# Patient Record
Sex: Female | Born: 1955
Health system: Southern US, Community
[De-identification: ages and names within clinical notes are randomized; demographics above are authoritative.]

## PROBLEM LIST (undated history)

## (undated) DIAGNOSIS — I10 Essential (primary) hypertension: Secondary | ICD-10-CM

## (undated) DIAGNOSIS — M459 Ankylosing spondylitis of unspecified sites in spine: Secondary | ICD-10-CM

## (undated) HISTORY — PX: WRIST SURGERY: SHX841

## (undated) HISTORY — DX: Essential (primary) hypertension: I10

## (undated) HISTORY — DX: Ankylosing spondylitis of unspecified sites in spine: M45.9

---

## 1999-05-08 ENCOUNTER — Encounter: Admission: RE | Admit: 1999-05-08 | Discharge: 1999-05-08 | Payer: Self-pay | Admitting: Family Medicine

## 1999-05-08 ENCOUNTER — Encounter: Payer: Self-pay | Admitting: Family Medicine

## 1999-05-12 ENCOUNTER — Encounter: Payer: Self-pay | Admitting: Emergency Medicine

## 1999-05-12 ENCOUNTER — Emergency Department (HOSPITAL_COMMUNITY): Admission: EM | Admit: 1999-05-12 | Discharge: 1999-05-12 | Payer: Self-pay | Admitting: Emergency Medicine

## 2000-05-09 ENCOUNTER — Encounter: Admission: RE | Admit: 2000-05-09 | Discharge: 2000-05-09 | Payer: Self-pay | Admitting: Obstetrics and Gynecology

## 2000-05-09 ENCOUNTER — Encounter: Payer: Self-pay | Admitting: Obstetrics and Gynecology

## 2001-05-10 ENCOUNTER — Encounter: Payer: Self-pay | Admitting: Obstetrics and Gynecology

## 2001-05-10 ENCOUNTER — Encounter: Admission: RE | Admit: 2001-05-10 | Discharge: 2001-05-10 | Payer: Self-pay | Admitting: Obstetrics and Gynecology

## 2001-11-16 ENCOUNTER — Encounter (INDEPENDENT_AMBULATORY_CARE_PROVIDER_SITE_OTHER): Payer: Self-pay | Admitting: Specialist

## 2001-11-16 ENCOUNTER — Ambulatory Visit (HOSPITAL_BASED_OUTPATIENT_CLINIC_OR_DEPARTMENT_OTHER): Admission: RE | Admit: 2001-11-16 | Discharge: 2001-11-16 | Payer: Self-pay | Admitting: Obstetrics and Gynecology

## 2002-05-15 ENCOUNTER — Encounter: Payer: Self-pay | Admitting: Obstetrics and Gynecology

## 2002-05-15 ENCOUNTER — Encounter: Admission: RE | Admit: 2002-05-15 | Discharge: 2002-05-15 | Payer: Self-pay | Admitting: Obstetrics and Gynecology

## 2003-06-07 ENCOUNTER — Encounter: Admission: RE | Admit: 2003-06-07 | Discharge: 2003-06-07 | Payer: Self-pay | Admitting: Obstetrics and Gynecology

## 2004-06-09 ENCOUNTER — Encounter: Admission: RE | Admit: 2004-06-09 | Discharge: 2004-06-09 | Payer: Self-pay | Admitting: Obstetrics and Gynecology

## 2005-06-23 ENCOUNTER — Encounter: Admission: RE | Admit: 2005-06-23 | Discharge: 2005-06-23 | Payer: Self-pay | Admitting: Obstetrics and Gynecology

## 2006-06-27 ENCOUNTER — Encounter: Admission: RE | Admit: 2006-06-27 | Discharge: 2006-06-27 | Payer: Self-pay | Admitting: Obstetrics and Gynecology

## 2007-07-06 ENCOUNTER — Encounter: Admission: RE | Admit: 2007-07-06 | Discharge: 2007-07-06 | Payer: Self-pay | Admitting: Family Medicine

## 2008-02-28 ENCOUNTER — Encounter: Admission: RE | Admit: 2008-02-28 | Discharge: 2008-02-28 | Payer: Self-pay | Admitting: Family Medicine

## 2008-07-08 ENCOUNTER — Encounter: Admission: RE | Admit: 2008-07-08 | Discharge: 2008-07-08 | Payer: Self-pay | Admitting: Obstetrics & Gynecology

## 2008-07-18 ENCOUNTER — Ambulatory Visit: Payer: Self-pay | Admitting: Sports Medicine

## 2008-07-18 DIAGNOSIS — M2020 Hallux rigidus, unspecified foot: Secondary | ICD-10-CM | POA: Insufficient documentation

## 2008-07-18 DIAGNOSIS — M545 Low back pain, unspecified: Secondary | ICD-10-CM | POA: Insufficient documentation

## 2008-07-18 DIAGNOSIS — S838X9A Sprain of other specified parts of unspecified knee, initial encounter: Secondary | ICD-10-CM | POA: Insufficient documentation

## 2008-07-18 DIAGNOSIS — I10 Essential (primary) hypertension: Secondary | ICD-10-CM | POA: Insufficient documentation

## 2008-07-18 DIAGNOSIS — S86819A Strain of other muscle(s) and tendon(s) at lower leg level, unspecified leg, initial encounter: Secondary | ICD-10-CM

## 2009-07-09 ENCOUNTER — Encounter: Admission: RE | Admit: 2009-07-09 | Discharge: 2009-07-09 | Payer: Self-pay | Admitting: Obstetrics & Gynecology

## 2009-07-22 ENCOUNTER — Ambulatory Visit: Payer: Self-pay | Admitting: Family Medicine

## 2009-07-22 DIAGNOSIS — M25569 Pain in unspecified knee: Secondary | ICD-10-CM | POA: Insufficient documentation

## 2009-07-22 DIAGNOSIS — M629 Disorder of muscle, unspecified: Secondary | ICD-10-CM | POA: Insufficient documentation

## 2010-03-19 NOTE — Assessment & Plan Note (Signed)
Summary: L KNEE PAIN,RUNNER,MC   Vital Signs:  Patient profile:   55 year old female Height:      65 inches Weight:      130 pounds BMI:     21.71 BP sitting:   130 / 82  Vitals Entered By: Lillia Pauls CMA (July 22, 2009 10:55 AM)  History of Present Illness: Pt presents with left lateral knee pain that has occurred since she ran a 41 K race in March. She noticed that the lateral portion or her left knee became very painful at about mile 3. Since that 10 K she has decreased her mileage and rested. However, she is training to run the [place] 10 K and has been working on increasing her mileage recently. Again, at mile 3 or so during her long run she noticed the lateral left knee pain. As soon as she stops to rest or walk, the pain dissipates. No known injuries or prior surgeries. Denies swelling and locking. Sometimes gets a posterior cramping sensation in her calf.   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   Head:  normocephalic and atraumatic.   Ears:  Normal hearing Mouth:  MMM Neck:  supple and full ROM.   Lungs:  normal respiratory effort.   Msk:  Left Knee: No bony abnormalities, edema or bruising Full ROM with pain on flexion pinch laterally + TTP over the IT band at Gerdy's tubercle No TTP along the joint line Normal MCL, LCL, ACL and PCL with special testing Neg McMurray's, extended leg bounce test 5/5 strength with resisted knee flexion and extension Tight IT band noted with TTP over distal portion with hip abducted Normal IR and ER of her hip 4/5 strength with resisted hip flexion  4+/5 strength with resisted abduction Mildy tight FABER testing  Right Knee: Normal inspection, palpation, FROM and strength Normal MCL, LCL, ACL and PCL with special testing Neg McMurray's and  extended leg bounce test Tight FABER testing 5/5 strength with resisted hip flexion 4+/5 strength with resisted hip abduction  Equal leg lengths Good running form  Feet: Bilateral accessory naviculars Bilateral bunnionettes Transverse arch breakdown Tight FABER   Impression & Recommendations:  Problem # 1:  KNEE PAIN (ICD-719.46) Assessment New Due to ITB syndrome 1. Ice massage for 20 minutes after running 2. 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 Use a foam roller for 5-10 minutes prior to running to stretch out and massage ITB 2. Given a hand out with multiple ITB stretches and some hip strengthening exercises to do daily 3. May continue running as tolerated on a flat softer surface 4. Return as needed in 4-6 weeks for follow-up

## 2010-07-03 NOTE — Op Note (Signed)
   NAME:  Tonya Davis, Tonya Davis                           ACCOUNT NO.:  [id number]   MEDICAL RECORD NO.:  [id number]                   PATIENT TYPE:  AMB   LOCATION:  [facility]                                 FACILITY:  [facility]   PHYSICIAN:  Katherine Roan, M.D.               DATE OF BIRTH:  Sep 07, 1955   DATE OF PROCEDURE:  11/16/2001  DATE OF DISCHARGE:                                 OPERATIVE REPORT   PREOPERATIVE DIAGNOSES:  Menorrhagia, posterior and uterine fibroids.   POSTOPERATIVE DIAGNOSES:  Menorrhagia, posterior and uterine fibroids.   OPERATION:  Hysteroscopy with resection.   DESCRIPTION OF PROCEDURE:  The patient was placed in lithotomy position,  prepped and draped in the usual fashion. The cervix dilated, endometrial  cavity was visualized. There was a posterior myoma which was resected and I  did a gentle endometrial resection. All the tissue was sent to the labs for  study. [name] tolerated this procedure well and was sent to the recovery room  in good condition.                                                Katherine Roan, M.D.    SDM/MEDQ  D:  11/16/2001  T:  11/17/2001  Job:  [id number]

## 2010-07-27 ENCOUNTER — Other Ambulatory Visit: Payer: Self-pay | Admitting: Obstetrics & Gynecology

## 2010-07-27 DIAGNOSIS — Z1231 Encounter for screening mammogram for malignant neoplasm of breast: Secondary | ICD-10-CM

## 2010-08-05 ENCOUNTER — Ambulatory Visit
Admission: RE | Admit: 2010-08-05 | Discharge: 2010-08-05 | Disposition: A | Discharge status: Home / Self Care | Payer: 59 | Source: Ambulatory Visit | Attending: Obstetrics & Gynecology | Admitting: Obstetrics & Gynecology

## 2010-08-05 DIAGNOSIS — Z1231 Encounter for screening mammogram for malignant neoplasm of breast: Secondary | ICD-10-CM

## 2010-12-25 ENCOUNTER — Ambulatory Visit (INDEPENDENT_AMBULATORY_CARE_PROVIDER_SITE_OTHER): Payer: 59 | Admitting: Sports Medicine

## 2010-12-25 VITALS — BP 120/76

## 2010-12-25 DIAGNOSIS — S838X9A Sprain of other specified parts of unspecified knee, initial encounter: Secondary | ICD-10-CM

## 2010-12-25 NOTE — Patient Instructions (Signed)
Be cautious with hills and too much distance.  Do hip abduction, flexion, and standing rotations exercises daily, try with a light ankle weight.    After getting stronger with the initial exercises- try doing sit to stand exercise holding dumbbells   Please follow up in 6 weeks

## 2010-12-25 NOTE — Progress Notes (Signed)
  Subjective:    Patient ID: Tonya Davis, female    DOB: [birth year], 55 y.o.   MRN: [id number]  HPI  Pt presents to clinic for evaluation of left anterior hip pain since 10/24/10 on a 10 mile run.  Runs about 20 miles per week.  Around the time pain started using step ups holding dumbells Has pain when going from sitting to standing With running has more pain with going uphill, in longer runs pain is in anterior L hip, and radiates around to posterior hip.   Review of Systems     Objective:   Physical Exam  RT hip- full ROM, rotational motion 75 deg Lt hip 75-80 deg rotational motion Hip flexion strength on rt good, strong on lt, but causes pain Leg lengths equal FABER normal bilaterally SI joints move freely IR and ER of lt hip good, causing no pain Lt hip abduction weak Rt hip abduction strong HS strong bilat  Arches neutral  1st MTP hypertrophy bilat, no true bunions     Assessment & Plan:

## 2010-12-25 NOTE — Assessment & Plan Note (Signed)
This appears to be a strain of the left iliopsoas muscle I think this was related to the weight lifting activity but probably was triggered by chronic weakness of the left gluteus medius  Exercises are given Reduce her running by about 40%  We should recheck this in about 6 weeks and then will steadily increase her activity again if responding well

## 2011-02-03 ENCOUNTER — Ambulatory Visit: Payer: 59 | Admitting: Sports Medicine

## 2011-02-24 ENCOUNTER — Ambulatory Visit (INDEPENDENT_AMBULATORY_CARE_PROVIDER_SITE_OTHER): Payer: 59 | Admitting: Sports Medicine

## 2011-02-24 VITALS — BP 114/68

## 2011-02-24 DIAGNOSIS — S838X9A Sprain of other specified parts of unspecified knee, initial encounter: Secondary | ICD-10-CM

## 2011-02-24 DIAGNOSIS — S86819A Strain of other muscle(s) and tendon(s) at lower leg level, unspecified leg, initial encounter: Secondary | ICD-10-CM

## 2011-02-24 NOTE — Patient Instructions (Signed)
Start doing step up exercises  Please follow up as needed  Thank you for seeing [place] today!

## 2011-02-24 NOTE — Assessment & Plan Note (Signed)
last visit we suspected that she had a hip flexor tendinitis probably her iliopsoas tendon  Rehabilitation exercises have worked very well and she is able to resume her running  She should continue these  Add more step exercises  Gradually increase her running mileage as tolerated Return when necessary

## 2011-02-24 NOTE — Progress Notes (Signed)
  Subjective:    Patient ID: Tonya Davis, female    DOB: [birth year], 56 y.o.   MRN: [id number]  HPI  Pt presents to clinic for f/u of Lt anterior hip pain which she reports is 60-70% improved. Pain would usually start after 10 mile runs and was worsening at last visit.   Now able to run up to 6.5 miles without pain.  Has decreased running to 15 mpw.  Taking mobic which is helpful.  Has some discomfort with sitting for longer periods of time.   Review of Systems     Objective:   Physical Exam  Hip ROM normal bilat No pain on resisted hip flexion on lt in 3 different positions Strong hip abduction and rotation on lt Able to do step up exercises without pain       Assessment & Plan:

## 2011-08-23 ENCOUNTER — Other Ambulatory Visit: Payer: Self-pay | Admitting: Obstetrics & Gynecology

## 2011-08-23 DIAGNOSIS — Z1231 Encounter for screening mammogram for malignant neoplasm of breast: Secondary | ICD-10-CM

## 2011-09-08 ENCOUNTER — Ambulatory Visit
Admission: RE | Admit: 2011-09-08 | Discharge: 2011-09-08 | Disposition: A | Discharge status: Home / Self Care | Payer: 59 | Source: Ambulatory Visit | Attending: Obstetrics & Gynecology | Admitting: Obstetrics & Gynecology

## 2011-09-08 DIAGNOSIS — Z1231 Encounter for screening mammogram for malignant neoplasm of breast: Secondary | ICD-10-CM

## 2012-08-22 ENCOUNTER — Other Ambulatory Visit: Payer: Self-pay | Admitting: Obstetrics & Gynecology

## 2012-08-22 DIAGNOSIS — Z1231 Encounter for screening mammogram for malignant neoplasm of breast: Secondary | ICD-10-CM

## 2012-09-08 ENCOUNTER — Ambulatory Visit: Payer: 59

## 2012-09-11 ENCOUNTER — Ambulatory Visit
Admission: RE | Admit: 2012-09-11 | Discharge: 2012-09-11 | Disposition: A | Discharge status: Home / Self Care | Payer: 59 | Source: Ambulatory Visit | Attending: Obstetrics & Gynecology | Admitting: Obstetrics & Gynecology

## 2012-09-11 ENCOUNTER — Other Ambulatory Visit: Payer: Self-pay | Admitting: Endocrinology

## 2012-09-11 DIAGNOSIS — Z1231 Encounter for screening mammogram for malignant neoplasm of breast: Secondary | ICD-10-CM

## 2012-09-11 DIAGNOSIS — N951 Menopausal and female climacteric states: Secondary | ICD-10-CM

## 2013-08-08 ENCOUNTER — Other Ambulatory Visit: Payer: Self-pay

## 2013-08-08 ENCOUNTER — Other Ambulatory Visit: Payer: Self-pay | Admitting: Endocrinology

## 2013-08-08 DIAGNOSIS — Z1231 Encounter for screening mammogram for malignant neoplasm of breast: Secondary | ICD-10-CM

## 2013-09-13 ENCOUNTER — Ambulatory Visit
Admission: RE | Admit: 2013-09-13 | Discharge: 2013-09-13 | Disposition: A | Discharge status: Home / Self Care | Payer: 59 | Source: Ambulatory Visit

## 2013-09-13 ENCOUNTER — Ambulatory Visit
Admission: RE | Admit: 2013-09-13 | Discharge: 2013-09-13 | Disposition: A | Discharge status: Home / Self Care | Payer: 59 | Source: Ambulatory Visit | Attending: Endocrinology | Admitting: Endocrinology

## 2013-09-13 ENCOUNTER — Encounter (INDEPENDENT_AMBULATORY_CARE_PROVIDER_SITE_OTHER): Payer: Self-pay

## 2013-09-13 DIAGNOSIS — N951 Menopausal and female climacteric states: Secondary | ICD-10-CM

## 2013-09-13 DIAGNOSIS — Z1231 Encounter for screening mammogram for malignant neoplasm of breast: Secondary | ICD-10-CM

## 2013-11-30 ENCOUNTER — Other Ambulatory Visit: Payer: Self-pay

## 2014-10-07 ENCOUNTER — Other Ambulatory Visit: Payer: Self-pay

## 2014-10-07 DIAGNOSIS — Z1231 Encounter for screening mammogram for malignant neoplasm of breast: Secondary | ICD-10-CM

## 2014-10-09 ENCOUNTER — Ambulatory Visit
Admission: RE | Admit: 2014-10-09 | Discharge: 2014-10-09 | Disposition: A | Discharge status: Home / Self Care | Payer: 59 | Source: Ambulatory Visit

## 2014-10-09 DIAGNOSIS — Z1231 Encounter for screening mammogram for malignant neoplasm of breast: Secondary | ICD-10-CM

## 2014-11-13 ENCOUNTER — Encounter: Payer: Self-pay | Admitting: Family Medicine

## 2014-11-13 ENCOUNTER — Ambulatory Visit (INDEPENDENT_AMBULATORY_CARE_PROVIDER_SITE_OTHER): Payer: 59 | Admitting: Family Medicine

## 2014-11-13 ENCOUNTER — Ambulatory Visit
Admission: RE | Admit: 2014-11-13 | Discharge: 2014-11-13 | Disposition: A | Discharge status: Home / Self Care | Payer: 59 | Source: Ambulatory Visit | Attending: Family Medicine | Admitting: Family Medicine

## 2014-11-13 VITALS — BP 111/77 | HR 58 | Ht 65.5 in | Wt 160.0 lb

## 2014-11-13 DIAGNOSIS — M79672 Pain in left foot: Secondary | ICD-10-CM

## 2014-11-13 NOTE — Assessment & Plan Note (Signed)
Would be we're presentation for a stress fracture or acute fracture of the left forefoot. She does have tenosynovium right [place] on ultrasound findings including focal areas of anechoic fluid with pain on pressure and hyperemia on Doppler. -We will obtain x-ray to verify there is no underlying fracture of her left foot. -Recommend ice comfortable shoes to take some of the stress off her extensor tendons. As well continue with her Mobic and can take Tylenol for intermittent pain -Another possibility is that the tenosynovium  is a flare of her ankylosing spondylitis. If this continues for 1-2 weeks I would consider a steroid taper to see if this improves her pain.

## 2014-11-13 NOTE — Progress Notes (Signed)
  Tonya Davis - 59 y.o. female MRN [id number]  Date of birth: [birth year] Tonya Davis is a 59 y.o. female who presents today for left dorsal foot pain.  Left dorsal foot pain, initial visit-patient presents today for ongoing left dorsal foot pain for the past week. She denies a specific injury but has been having swelling on the top of her dorsum of the foot. No increase in activity or changes in underlying physical activity. She did wear 1 pair of dansko shoes but otherwise has not changed her shoes or surface. There is no radiation or toes and she denies any previous injury or fracture to the left foot. She does have a history of ankylosing spondylitis with first MTP joint arthritis of the left foot. Currently on NSAIDs for the arthritis and followed by rheumatology.  PMHx - Updated and reviewed.  Contributory factors include: ankylosing spondylitis PSHx - Updated and reviewed.  Contributory factors include:  noncontributory FHx - Updated and reviewed.  Contributory factors include:  noncontributory Medications - Mobic   ROS Per HPI . 12 point negative otherwise  Exam:  Filed Vitals:   11/13/14 1443  BP: 111/77  Pulse: 58   Gen: NAD Cardiorespiratory - Normal respiratory effort/rate.  RRR Exam of the foot: There is visible soft tissue edema in the dorsum of the metatarsal shafts 2 through 5. No erythema. Tenderness palpation at the third metatarsal shaft/neck region. Tuning fork test and this area is negative. Squeeze test is negative with a negative Mulder's sign.she has full range of motion of the ankle and toes. Neurovascularly intact.  Imaging:  Ultrasound of the left foot reveals no obvious cortical abnormality of the metatarsal shaft neck or head of digits 2 through 5. She does have extensive localized tenosynovitisright [place] with anechoic fluid around the tendon sheaths

## 2014-11-27 ENCOUNTER — Ambulatory Visit (INDEPENDENT_AMBULATORY_CARE_PROVIDER_SITE_OTHER): Payer: 59 | Admitting: Family Medicine

## 2014-11-27 ENCOUNTER — Encounter: Payer: Self-pay | Admitting: Family Medicine

## 2014-11-27 VITALS — BP 140/102 | Ht 65.5 in | Wt 160.0 lb

## 2014-11-27 DIAGNOSIS — M79672 Pain in left foot: Secondary | ICD-10-CM

## 2014-11-27 MED ORDER — PREDNISONE 10 MG (21) PO TBPK: 10.0000 mg | ORAL_TABLET | Freq: Every day | ORAL | Status: DC

## 2014-11-27 NOTE — Assessment & Plan Note (Signed)
Possible tenosynovitis of extensor tendons of her foot secondary to extensive walking. X-rays reviewed of her left foot which do not show acute fracture or cortical disruption of her metatarsal shafts. She does have significant first MTP osteoarthritis with hallux rigidus. -Steroid Dosepak for inflammation. Continue with ice and activity modification and consider toe exercises. -If she does not get much improvement with this would consider green insoles with metatarsal pads and possible orthotics down the road.

## 2014-11-27 NOTE — Progress Notes (Signed)
  Tonya SprangSusan T Davis - 59 y.o. female MRN [id number]  Date of birth: January 15, 1956 Tonya SprangSusan T Davis is a 59 y.o. female who presents today for left dorsal foot pain.  Left dorsal foot pain 11/13/14 -patient presents today for ongoing left dorsal foot pain for the past week. She denies a specific injury but has been having swelling on the top of her dorsum of the foot. No increase in activity or changes in underlying physical activity. She did wear 1 pair of dansko shoes but otherwise has not changed her shoes or surface. There is no radiation or toes and she denies any previous injury or fracture to the left foot. She does have a history of ankylosing spondylitis with first MTP joint arthritis of the left foot. Currently on NSAIDs for the arthritis and followed by rheumatology.  Visit 11/27/14-patient returns today for follow-up of left foot pain dorsum aspect. She is starting to have pain in the plantar aspect of her metatarsal heads. She has cut back on her walking and has been icing along with taking Mobic, which has helped somewhat for her. No new injury and denies painful ambulation.    PMHx - Updated and reviewed.  Contributory factors include: ankylosing spondylitis PSHx - Updated and reviewed.  Contributory factors include:  noncontributory FHx - Updated and reviewed.  Contributory factors include:  noncontributory Medications - Mobic   ROS Per HPI . 12 point negative otherwise  Exam:  Filed Vitals:   11/27/14 1542  BP: 140/102   Gen: NAD Cardiorespiratory - Normal respiratory effort/rate.  RRR Exam of the foot: There is visible soft tissue edema in the dorsum of the metatarsal shafts 2 through 5. No erythema. Diffuse TTP MT heads/extensor digitorum tendons worse w/ toe extension.  She has full range of motion of the ankle and toes. Neurovascularly intact.

## 2015-11-05 ENCOUNTER — Other Ambulatory Visit: Payer: Self-pay | Admitting: Obstetrics & Gynecology

## 2015-11-05 DIAGNOSIS — Z1231 Encounter for screening mammogram for malignant neoplasm of breast: Secondary | ICD-10-CM

## 2015-11-14 ENCOUNTER — Ambulatory Visit
Admission: RE | Admit: 2015-11-14 | Discharge: 2015-11-14 | Disposition: A | Discharge status: Home / Self Care | Payer: 59 | Source: Ambulatory Visit | Attending: Obstetrics & Gynecology | Admitting: Obstetrics & Gynecology

## 2015-11-14 DIAGNOSIS — Z1231 Encounter for screening mammogram for malignant neoplasm of breast: Secondary | ICD-10-CM

## 2016-04-06 DIAGNOSIS — H15103 Unspecified episcleritis, bilateral: Secondary | ICD-10-CM | POA: Diagnosis not present

## 2016-04-06 DIAGNOSIS — R5383 Other fatigue: Secondary | ICD-10-CM | POA: Diagnosis not present

## 2016-04-06 DIAGNOSIS — M545 Low back pain: Secondary | ICD-10-CM | POA: Diagnosis not present

## 2016-04-06 DIAGNOSIS — M459 Ankylosing spondylitis of unspecified sites in spine: Secondary | ICD-10-CM | POA: Diagnosis not present

## 2016-05-13 DIAGNOSIS — I10 Essential (primary) hypertension: Secondary | ICD-10-CM | POA: Diagnosis not present

## 2016-05-13 DIAGNOSIS — N390 Urinary tract infection, site not specified: Secondary | ICD-10-CM | POA: Diagnosis not present

## 2016-05-13 DIAGNOSIS — Z0000 Encounter for general adult medical examination without abnormal findings: Secondary | ICD-10-CM | POA: Diagnosis not present

## 2016-06-23 DIAGNOSIS — I471 Supraventricular tachycardia: Secondary | ICD-10-CM | POA: Diagnosis not present

## 2016-06-23 DIAGNOSIS — R002 Palpitations: Secondary | ICD-10-CM | POA: Diagnosis not present

## 2016-06-23 DIAGNOSIS — R197 Diarrhea, unspecified: Secondary | ICD-10-CM | POA: Diagnosis not present

## 2016-07-05 DIAGNOSIS — H15103 Unspecified episcleritis, bilateral: Secondary | ICD-10-CM | POA: Diagnosis not present

## 2016-07-05 DIAGNOSIS — M459 Ankylosing spondylitis of unspecified sites in spine: Secondary | ICD-10-CM | POA: Diagnosis not present

## 2016-08-02 DIAGNOSIS — J029 Acute pharyngitis, unspecified: Secondary | ICD-10-CM | POA: Diagnosis not present

## 2016-08-02 DIAGNOSIS — K121 Other forms of stomatitis: Secondary | ICD-10-CM | POA: Diagnosis not present

## 2016-08-16 DIAGNOSIS — J029 Acute pharyngitis, unspecified: Secondary | ICD-10-CM | POA: Diagnosis not present

## 2016-08-16 DIAGNOSIS — K120 Recurrent oral aphthae: Secondary | ICD-10-CM | POA: Diagnosis not present

## 2016-09-06 DIAGNOSIS — S00522A Blister (nonthermal) of oral cavity, initial encounter: Secondary | ICD-10-CM | POA: Diagnosis not present

## 2016-10-06 DIAGNOSIS — M2550 Pain in unspecified joint: Secondary | ICD-10-CM | POA: Diagnosis not present

## 2016-10-06 DIAGNOSIS — M459 Ankylosing spondylitis of unspecified sites in spine: Secondary | ICD-10-CM | POA: Diagnosis not present

## 2016-11-09 DIAGNOSIS — M545 Low back pain: Secondary | ICD-10-CM | POA: Diagnosis not present

## 2016-11-09 DIAGNOSIS — M9901 Segmental and somatic dysfunction of cervical region: Secondary | ICD-10-CM | POA: Diagnosis not present

## 2016-11-09 DIAGNOSIS — M9903 Segmental and somatic dysfunction of lumbar region: Secondary | ICD-10-CM | POA: Diagnosis not present

## 2016-11-10 DIAGNOSIS — M9903 Segmental and somatic dysfunction of lumbar region: Secondary | ICD-10-CM | POA: Diagnosis not present

## 2016-11-10 DIAGNOSIS — M545 Low back pain: Secondary | ICD-10-CM | POA: Diagnosis not present

## 2016-11-10 DIAGNOSIS — M9901 Segmental and somatic dysfunction of cervical region: Secondary | ICD-10-CM | POA: Diagnosis not present

## 2016-11-11 DIAGNOSIS — M545 Low back pain: Secondary | ICD-10-CM | POA: Diagnosis not present

## 2016-11-11 DIAGNOSIS — M9903 Segmental and somatic dysfunction of lumbar region: Secondary | ICD-10-CM | POA: Diagnosis not present

## 2016-11-11 DIAGNOSIS — M9901 Segmental and somatic dysfunction of cervical region: Secondary | ICD-10-CM | POA: Diagnosis not present

## 2016-11-15 DIAGNOSIS — M9901 Segmental and somatic dysfunction of cervical region: Secondary | ICD-10-CM | POA: Diagnosis not present

## 2016-11-15 DIAGNOSIS — M545 Low back pain: Secondary | ICD-10-CM | POA: Diagnosis not present

## 2016-11-15 DIAGNOSIS — M9903 Segmental and somatic dysfunction of lumbar region: Secondary | ICD-10-CM | POA: Diagnosis not present

## 2016-11-17 DIAGNOSIS — M9901 Segmental and somatic dysfunction of cervical region: Secondary | ICD-10-CM | POA: Diagnosis not present

## 2016-11-17 DIAGNOSIS — M9903 Segmental and somatic dysfunction of lumbar region: Secondary | ICD-10-CM | POA: Diagnosis not present

## 2016-11-17 DIAGNOSIS — M545 Low back pain: Secondary | ICD-10-CM | POA: Diagnosis not present

## 2016-11-18 DIAGNOSIS — M545 Low back pain: Secondary | ICD-10-CM | POA: Diagnosis not present

## 2016-11-18 DIAGNOSIS — M9903 Segmental and somatic dysfunction of lumbar region: Secondary | ICD-10-CM | POA: Diagnosis not present

## 2016-11-18 DIAGNOSIS — M9901 Segmental and somatic dysfunction of cervical region: Secondary | ICD-10-CM | POA: Diagnosis not present

## 2016-12-14 DIAGNOSIS — Z23 Encounter for immunization: Secondary | ICD-10-CM | POA: Diagnosis not present

## 2016-12-24 ENCOUNTER — Other Ambulatory Visit: Payer: Self-pay | Admitting: Family Medicine

## 2016-12-24 DIAGNOSIS — Z1231 Encounter for screening mammogram for malignant neoplasm of breast: Secondary | ICD-10-CM

## 2017-01-10 IMAGING — CR DG FOOT COMPLETE 3+V*L*
3 series · 3 of 3 positions shown · non-contrast
Comparison: 04/14/2010

CLINICAL DATA: Left foot pain and swelling injury, initial
encounter

EXAM:
LEFT FOOT - COMPLETE 3+ VIEW

[view not recorded (1 of 3)]
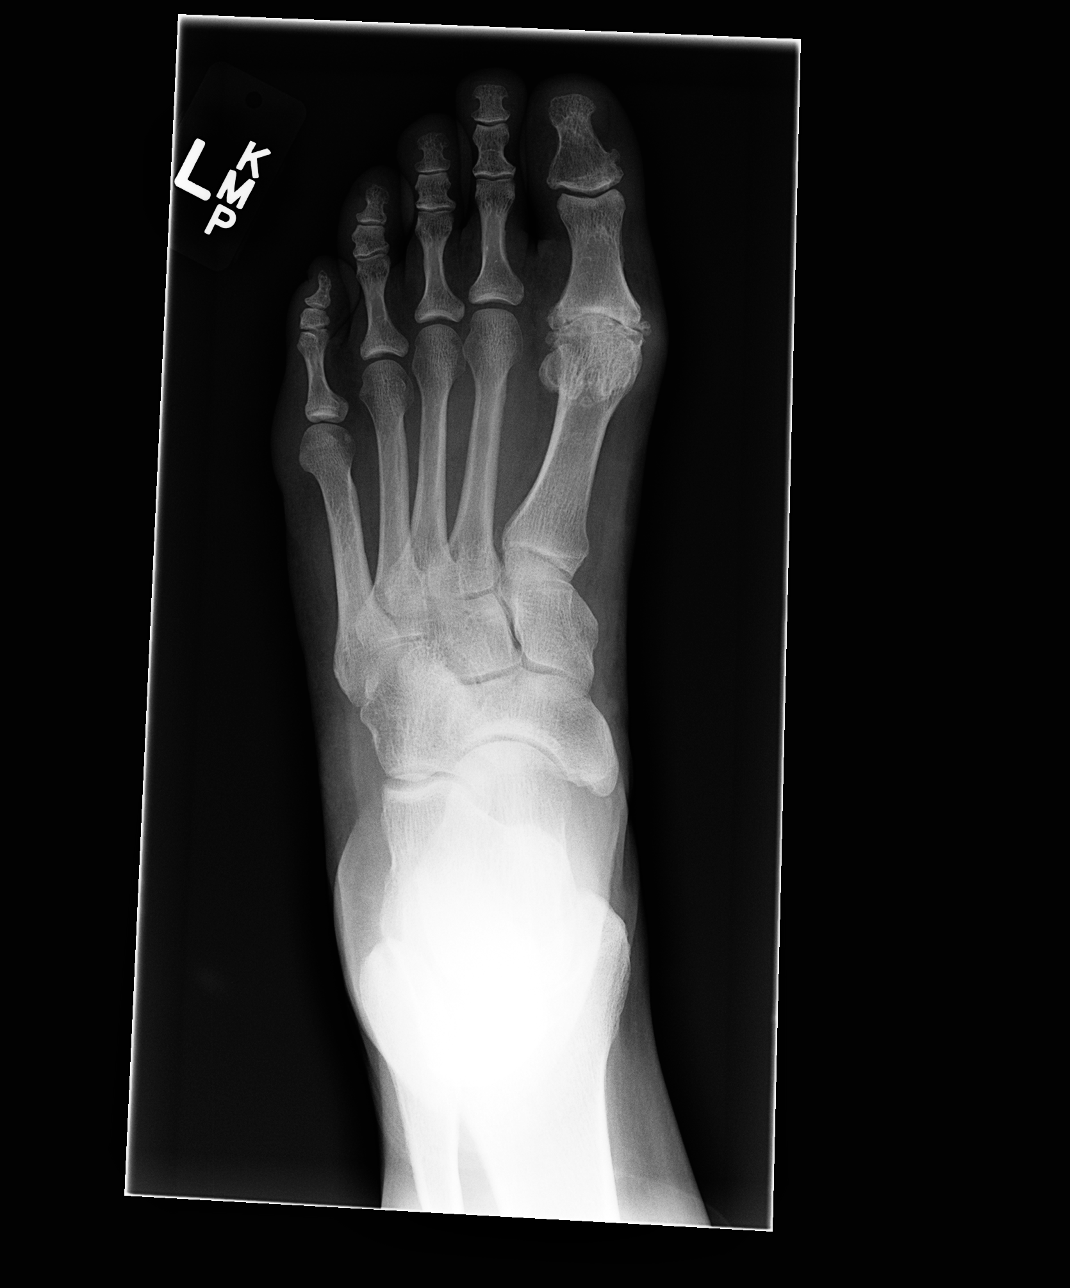

[view not recorded (2 of 3)]
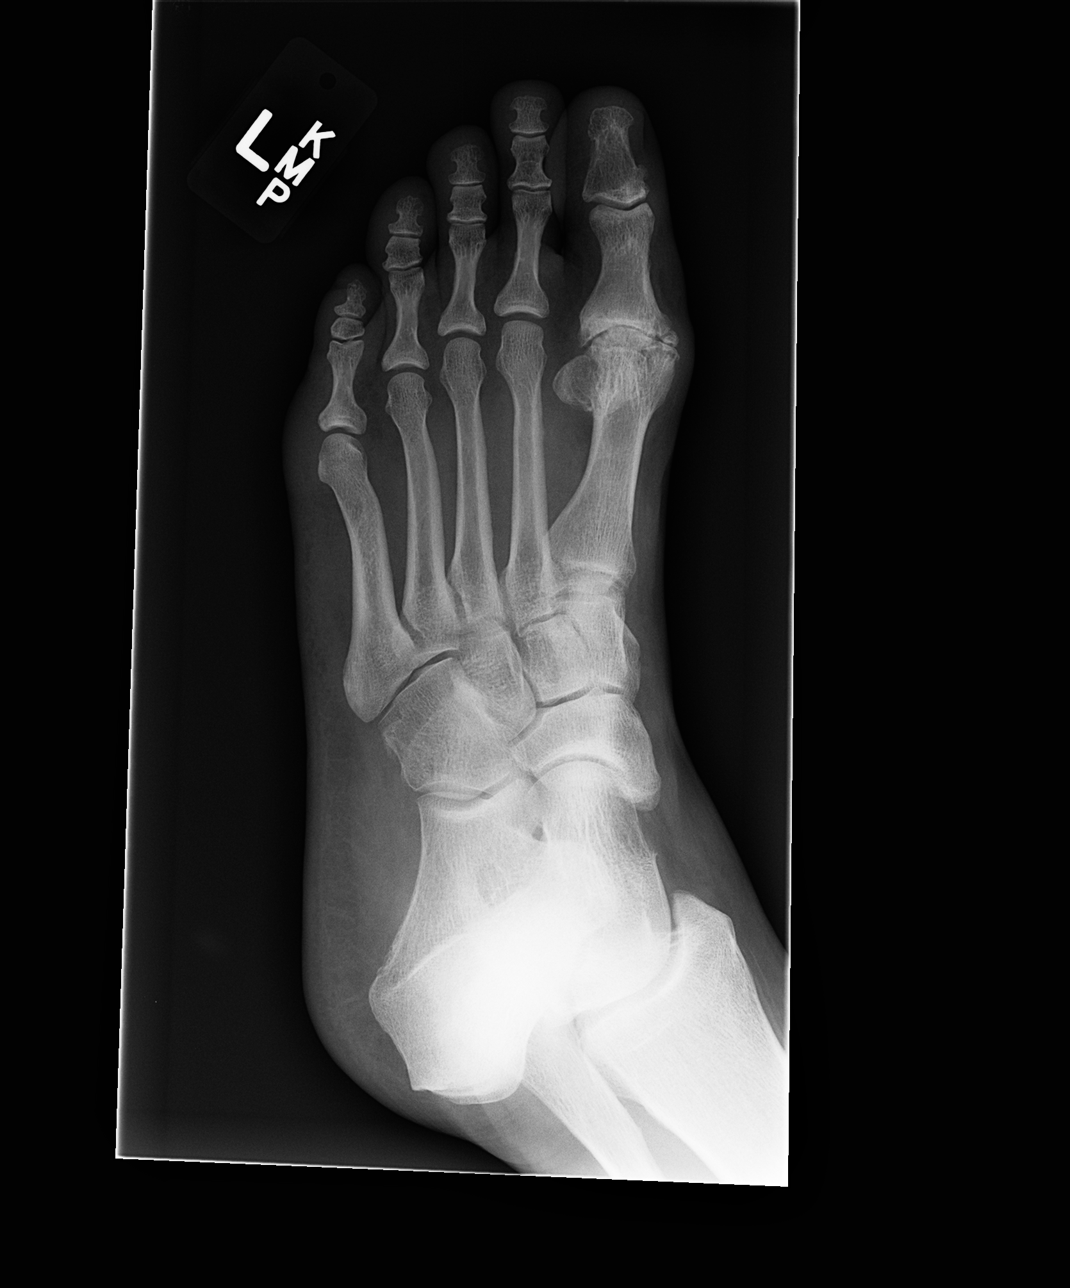

[view not recorded (3 of 3)]
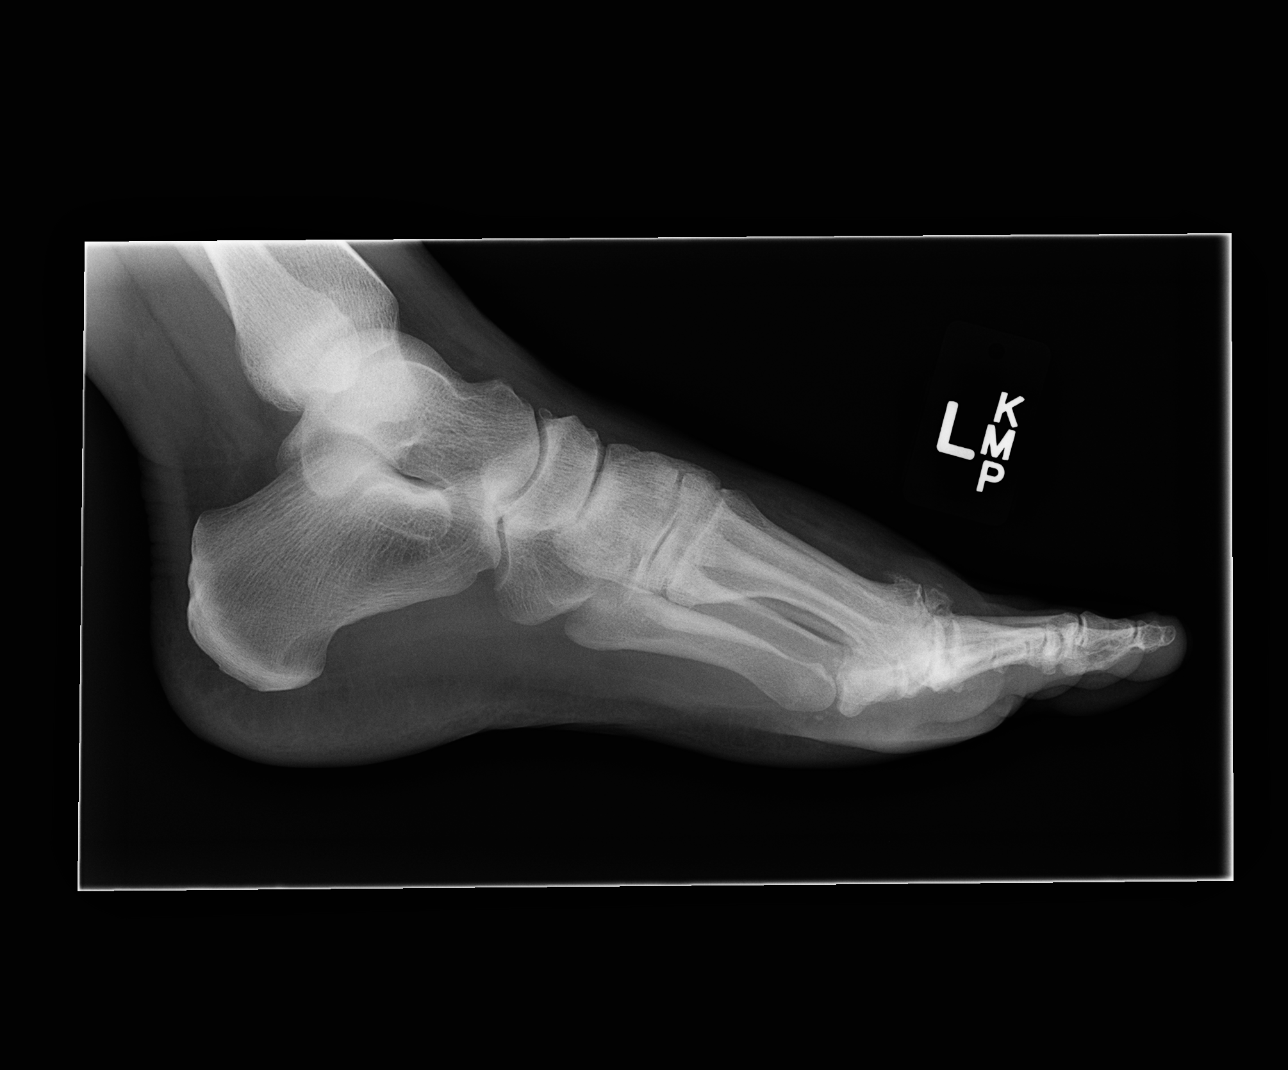

[3 of 3 positions shown; findings below may reference images not displayed]

FINDINGS: Significant degenerative changes are noted in the first MTP joint.
These have progressed in the interval from the prior exam. Mild
associated soft tissue swelling is noted. No other degenerative
changes are seen. No acute fracture or dislocation is noted.
IMPRESSION: Progressive degenerative change at the first MTP joint. No acute
abnormality is noted.

## 2017-01-24 ENCOUNTER — Ambulatory Visit: Payer: 59

## 2017-03-03 ENCOUNTER — Ambulatory Visit
Admission: RE | Admit: 2017-03-03 | Discharge: 2017-03-03 | Disposition: A | Discharge status: Home / Self Care | Payer: 59 | Source: Ambulatory Visit | Attending: Family Medicine | Admitting: Family Medicine

## 2017-03-03 DIAGNOSIS — Z1231 Encounter for screening mammogram for malignant neoplasm of breast: Secondary | ICD-10-CM

## 2017-04-07 DIAGNOSIS — H15103 Unspecified episcleritis, bilateral: Secondary | ICD-10-CM | POA: Diagnosis not present

## 2017-04-07 DIAGNOSIS — M459 Ankylosing spondylitis of unspecified sites in spine: Secondary | ICD-10-CM | POA: Diagnosis not present

## 2017-05-25 DIAGNOSIS — M4690 Unspecified inflammatory spondylopathy, site unspecified: Secondary | ICD-10-CM | POA: Diagnosis not present

## 2017-05-25 DIAGNOSIS — J302 Other seasonal allergic rhinitis: Secondary | ICD-10-CM | POA: Diagnosis not present

## 2017-05-25 DIAGNOSIS — Z0000 Encounter for general adult medical examination without abnormal findings: Secondary | ICD-10-CM | POA: Diagnosis not present

## 2017-05-25 DIAGNOSIS — I10 Essential (primary) hypertension: Secondary | ICD-10-CM | POA: Diagnosis not present

## 2017-10-06 DIAGNOSIS — M459 Ankylosing spondylitis of unspecified sites in spine: Secondary | ICD-10-CM | POA: Diagnosis not present

## 2017-10-06 DIAGNOSIS — H15103 Unspecified episcleritis, bilateral: Secondary | ICD-10-CM | POA: Diagnosis not present

## 2017-12-09 DIAGNOSIS — Z23 Encounter for immunization: Secondary | ICD-10-CM | POA: Diagnosis not present

## 2017-12-21 DIAGNOSIS — K219 Gastro-esophageal reflux disease without esophagitis: Secondary | ICD-10-CM | POA: Diagnosis not present

## 2017-12-21 DIAGNOSIS — R140 Abdominal distension (gaseous): Secondary | ICD-10-CM | POA: Diagnosis not present

## 2018-01-04 ENCOUNTER — Encounter: Payer: Self-pay | Admitting: Obstetrics & Gynecology

## 2018-01-04 ENCOUNTER — Ambulatory Visit: Payer: 59 | Admitting: Obstetrics & Gynecology

## 2018-01-04 VITALS — BP 140/90 | Ht 65.0 in | Wt 133.4 lb

## 2018-01-04 DIAGNOSIS — Z01419 Encounter for gynecological examination (general) (routine) without abnormal findings: Secondary | ICD-10-CM | POA: Diagnosis not present

## 2018-01-04 DIAGNOSIS — Z13820 Encounter for screening for osteoporosis: Secondary | ICD-10-CM | POA: Diagnosis not present

## 2018-01-04 DIAGNOSIS — Z780 Asymptomatic menopausal state: Secondary | ICD-10-CM

## 2018-01-04 NOTE — Progress Notes (Signed)
Tonya Davis [birth year] [id number]   History:    62 y.o. G0 Married.  2 adopted daughters 60 yo in [place] and 60 [place] state.  RP:  Established patient presenting for annual gyn exam   HPI: Menopause, well on no HRT.  No PMB.  No pelvic pain.  No pain with IC, except dryness, will try coconut oil.  Breasts wnl.  BMI 22.2.  Fit and eating well.  Immune disorder, did the nutritional protocol.  Health Labs with Fam MD.  Past medical history,surgical history, family history and social history were all reviewed and documented in the EPIC chart.  Gynecologic History No LMP recorded. Patient is postmenopausal. Contraception: post menopausal status /Vasectomy Last Pap: 11/2015. Results were: Negative/HPV HR neg Last mammogram: 02/2017. Results were: Negative Bone Density: 08/2013 Normal Colonoscopy: 2016  Obstetric History OB History  Gravida Para Term Preterm AB Living  0            SAB TAB Ectopic Multiple Live Births             Obstetric Comments  2 adopted children     ROS: A ROS was performed and pertinent positives and negatives are included in the history.  GENERAL: No fevers or chills. HEENT: No change in vision, no earache, sore throat or sinus congestion. NECK: No pain or stiffness. CARDIOVASCULAR: No chest pain or pressure. No palpitations. PULMONARY: No shortness of breath, cough or wheeze. GASTROINTESTINAL: No abdominal pain, nausea, vomiting or diarrhea, melena or bright red blood per rectum. GENITOURINARY: No urinary frequency, urgency, hesitancy or dysuria. MUSCULOSKELETAL: No joint or muscle pain, no back pain, no recent trauma. DERMATOLOGIC: No rash, no itching, no lesions. ENDOCRINE: No polyuria, polydipsia, no heat or cold intolerance. No recent change in weight. HEMATOLOGICAL: No anemia or easy bruising or bleeding. NEUROLOGIC: No headache, seizures, numbness, tingling or weakness. PSYCHIATRIC: No depression, no loss of interest in normal activity or change in  sleep pattern.     Exam:   BP 140/90   Ht 5\' 5"  (1.651 m)   Wt 133 lb 6.4 oz (60.5 kg)   BMI 22.20 kg/m   Body mass index is 22.2 kg/m.  General appearance : Well developed well nourished female. No acute distress HEENT: Eyes: no retinal hemorrhage or exudates,  Neck supple, trachea midline, no carotid bruits, no thyroidmegaly Lungs: Clear to auscultation, no rhonchi or wheezes, or rib retractions  Heart: Regular rate and rhythm, no murmurs or gallops Breast:Examined in sitting and supine position were symmetrical in appearance, no palpable masses or tenderness,  no skin retraction, no nipple inversion, no nipple discharge, no skin discoloration, no axillary or supraclavicular lymphadenopathy Abdomen: no palpable masses or tenderness, no rebound or guarding Extremities: no edema or skin discoloration or tenderness  Pelvic: Vulva: Normal             Vagina: No gross lesions or discharge  Cervix: No gross lesions or discharge.    Uterus  AV, normal size, shape and consistency, non-tender and mobile  Adnexa  Without masses or tenderness  Anus: Normal   Assessment/Plan:  62 y.o. female for annual exam   1. Encounter for routine gynecological examination with Papanicolaou smear of cervix Normal gynecologic exam in menopause.  Pap test was negative with negative high-risk HPV in October 2017.  Will repeat Pap test next year.  Breast exam normal.  Screening mammogram was negative in January 2019.  Health labs with family physician.  Good body mass  index at 22.2.  Continue with healthy nutrition and fitness.  2. Postmenopausal Well on no hormone replacement therapy.  No postmenopausal bleeding.  3. Screening for osteoporosis Vitamin D supplements, calcium intake of 1.5 g/day and regular weightbearing physical activities recommended.  Patient will follow up here for a bone density. - DG Bone Density; Future   Genia Del MD, 3:16 PM 01/04/2018

## 2018-01-04 NOTE — Patient Instructions (Addendum)
1. Encounter for routine gynecological examination with Papanicolaou smear of cervix Normal gynecologic exam in menopause.  Pap test was negative with negative high-risk HPV in October 2017.  Will repeat Pap test next year.  Breast exam normal.  Screening mammogram was negative in January 2019.  Health labs with family physician.  Good body mass index at 22.2.  Continue with healthy nutrition and fitness.  2. Postmenopausal Well on no hormone replacement therapy.  No postmenopausal bleeding.  3. Screening for osteoporosis Vitamin D supplements, calcium intake of 1.5 g/day and regular weightbearing physical activities recommended.  Patient will follow up here for a bone density. - DG Bone Density; Future  Tonya Davis, it was a pleasure seeing you today!  I will inform you of your bone density results when available.

## 2018-01-05 LAB — PAP IG W/ RFLX HPV ASCU

## 2018-01-06 ENCOUNTER — Encounter: Payer: Self-pay | Admitting: *Deleted

## 2018-03-02 ENCOUNTER — Other Ambulatory Visit: Payer: Self-pay | Admitting: Obstetrics & Gynecology

## 2018-03-02 ENCOUNTER — Ambulatory Visit (INDEPENDENT_AMBULATORY_CARE_PROVIDER_SITE_OTHER): Payer: 59

## 2018-03-02 DIAGNOSIS — M8589 Other specified disorders of bone density and structure, multiple sites: Secondary | ICD-10-CM

## 2018-03-02 DIAGNOSIS — Z13820 Encounter for screening for osteoporosis: Secondary | ICD-10-CM

## 2018-04-07 DIAGNOSIS — M459 Ankylosing spondylitis of unspecified sites in spine: Secondary | ICD-10-CM | POA: Diagnosis not present

## 2018-04-07 DIAGNOSIS — H15103 Unspecified episcleritis, bilateral: Secondary | ICD-10-CM | POA: Diagnosis not present

## 2018-05-31 DIAGNOSIS — J302 Other seasonal allergic rhinitis: Secondary | ICD-10-CM | POA: Diagnosis not present

## 2018-05-31 DIAGNOSIS — M4690 Unspecified inflammatory spondylopathy, site unspecified: Secondary | ICD-10-CM | POA: Diagnosis not present

## 2018-05-31 DIAGNOSIS — I10 Essential (primary) hypertension: Secondary | ICD-10-CM | POA: Diagnosis not present

## 2018-09-07 ENCOUNTER — Other Ambulatory Visit: Payer: Self-pay

## 2018-09-07 DIAGNOSIS — Z20822 Contact with and (suspected) exposure to covid-19: Secondary | ICD-10-CM

## 2018-09-10 LAB — NOVEL CORONAVIRUS, NAA: SARS-CoV-2, NAA: NOT DETECTED

## 2019-01-05 ENCOUNTER — Other Ambulatory Visit: Payer: Self-pay

## 2019-01-08 ENCOUNTER — Ambulatory Visit (INDEPENDENT_AMBULATORY_CARE_PROVIDER_SITE_OTHER): Payer: 59 | Admitting: Obstetrics & Gynecology

## 2019-01-08 ENCOUNTER — Other Ambulatory Visit: Payer: Self-pay

## 2019-01-08 ENCOUNTER — Encounter: Payer: Self-pay | Admitting: Obstetrics & Gynecology

## 2019-01-08 VITALS — BP 132/84 | Ht 65.0 in | Wt 141.0 lb

## 2019-01-08 DIAGNOSIS — Z780 Asymptomatic menopausal state: Secondary | ICD-10-CM | POA: Diagnosis not present

## 2019-01-08 DIAGNOSIS — M8589 Other specified disorders of bone density and structure, multiple sites: Secondary | ICD-10-CM

## 2019-01-08 DIAGNOSIS — Z01419 Encounter for gynecological examination (general) (routine) without abnormal findings: Secondary | ICD-10-CM | POA: Diagnosis not present

## 2019-01-08 NOTE — Patient Instructions (Signed)
1. Well female exam with routine gynecological exam Normal gynecologic exam in menopause.  Pap test November 2019 was negative, no indication to repeat this year.  Breast exam normal.  Overdue for screening mammogram, will schedule now.  Colonoscopy in 2016.  Good body mass index at 23.46.  Continue with fitness and healthy nutrition.  Health labs with family physician.  2. Postmenopausal Well on no hormone replacement therapy.  No postmenopausal bleeding.  3. Osteopenia of multiple sites Very mild osteopenia on bone density January 2020.  May repeat bone density at 2 to 3 years.  Vitamin D supplements, calcium intake of 1200 mg daily and regular weightbearing physical activities to continue.  Tonya Davis, it was a pleasure seeing you today!

## 2019-01-08 NOTE — Progress Notes (Signed)
Tonya Davis 1955/07/25 [id number]   History:    63 y.o.  G0 Married. Partnership for children.  2 adopted daughters 56 yo in [place] and 66 yo [place].  RP:  Established patient presenting for annual gyn exam   HPI: Menopause, well on no HRT.  No PMB.  No pelvic pain.  No pain with IC, except dryness, will try coconut oil.  Breasts wnl.  BMI 23.46.  Fit and eating well. Anksyng Spondylitis  (Immune disorder), doing the nutritional protocol.  Health Labs with Fam MD.  Past medical history,surgical history, family history and social history were all reviewed and documented in the EPIC chart.  Gynecologic History No LMP recorded. Patient is postmenopausal. Contraception: post menopausal status Last Pap: 12/2017. Results were: Negative Last mammogram: 02/2017. Results were: Negative Bone Density: 02/2018 Osteopenia Colonoscopy: 2016  Obstetric History OB History  Gravida Para Term Preterm AB Living  0            SAB TAB Ectopic Multiple Live Births             Obstetric Comments  2 adopted children     ROS: A ROS was performed and pertinent positives and negatives are included in the history.  GENERAL: No fevers or chills. HEENT: No change in vision, no earache, sore throat or sinus congestion. NECK: No pain or stiffness. CARDIOVASCULAR: No chest pain or pressure. No palpitations. PULMONARY: No shortness of breath, cough or wheeze. GASTROINTESTINAL: No abdominal pain, nausea, vomiting or diarrhea, melena or bright red blood per rectum. GENITOURINARY: No urinary frequency, urgency, hesitancy or dysuria. MUSCULOSKELETAL: No joint or muscle pain, no back pain, no recent trauma. DERMATOLOGIC: No rash, no itching, no lesions. ENDOCRINE: No polyuria, polydipsia, no heat or cold intolerance. No recent change in weight. HEMATOLOGICAL: No anemia or easy bruising or bleeding. NEUROLOGIC: No headache, seizures, numbness, tingling or weakness. PSYCHIATRIC: No depression, no loss of  interest in normal activity or change in sleep pattern.     Exam:   BP 132/84    Ht 5\' 5"  (1.651 m)    Wt 141 lb (64 kg)    BMI 23.46 kg/m   Body mass index is 23.46 kg/m.  General appearance : Well developed well nourished female. No acute distress HEENT: Eyes: no retinal hemorrhage or exudates,  Neck supple, trachea midline, no carotid bruits, no thyroidmegaly Lungs: Clear to auscultation, no rhonchi or wheezes, or rib retractions  Heart: Regular rate and rhythm, no murmurs or gallops Breast:Examined in sitting and supine position were symmetrical in appearance, no palpable masses or tenderness,  no skin retraction, no nipple inversion, no nipple discharge, no skin discoloration, no axillary or supraclavicular lymphadenopathy Abdomen: no palpable masses or tenderness, no rebound or guarding Extremities: no edema or skin discoloration or tenderness  Pelvic: Vulva: Normal             Vagina: No gross lesions or discharge  Cervix: No gross lesions or discharge  Uterus  RV, normal size, mildly nodular, non-tender and mobile  Adnexa  Without masses or tenderness  Anus: Normal   Assessment/Plan:  63 y.o. female for annual exam   1. Well female exam with routine gynecological exam Normal gynecologic exam in menopause.  Pap test November 2019 was negative, no indication to repeat this year.  Breast exam normal.  Overdue for screening mammogram, will schedule now.  Colonoscopy in 2016.  Good body mass index at 23.46.  Continue with fitness and healthy nutrition.  Health labs with family physician.  2. Postmenopausal Well on no hormone replacement therapy.  No postmenopausal bleeding.  3. Osteopenia of multiple sites Very mild osteopenia on bone density January 2020.  May repeat bone density at 2 to 3 years.  Vitamin D supplements, calcium intake of 1200 mg daily and regular weightbearing physical activities to continue.  Princess Bruins MD, 4:22 PM 01/08/2019

## 2019-08-29 ENCOUNTER — Other Ambulatory Visit: Payer: Self-pay | Admitting: Family Medicine

## 2019-08-29 DIAGNOSIS — Z1231 Encounter for screening mammogram for malignant neoplasm of breast: Secondary | ICD-10-CM

## 2019-09-04 ENCOUNTER — Ambulatory Visit
Admission: RE | Admit: 2019-09-04 | Discharge: 2019-09-04 | Disposition: A | Discharge status: Home / Self Care | Payer: 59 | Source: Ambulatory Visit | Attending: Family Medicine | Admitting: Family Medicine

## 2019-09-04 ENCOUNTER — Other Ambulatory Visit: Payer: Self-pay

## 2019-09-04 DIAGNOSIS — Z1231 Encounter for screening mammogram for malignant neoplasm of breast: Secondary | ICD-10-CM

## 2020-01-01 ENCOUNTER — Other Ambulatory Visit: Payer: Self-pay | Admitting: *Deleted

## 2020-01-01 DIAGNOSIS — R002 Palpitations: Secondary | ICD-10-CM

## 2020-01-13 ENCOUNTER — Ambulatory Visit: Payer: 59

## 2020-01-14 ENCOUNTER — Encounter: Payer: 59 | Admitting: Obstetrics & Gynecology

## 2020-01-15 ENCOUNTER — Encounter: Payer: Self-pay | Admitting: Obstetrics & Gynecology

## 2020-01-15 ENCOUNTER — Ambulatory Visit (INDEPENDENT_AMBULATORY_CARE_PROVIDER_SITE_OTHER): Payer: 59 | Admitting: Obstetrics & Gynecology

## 2020-01-15 ENCOUNTER — Other Ambulatory Visit: Payer: Self-pay

## 2020-01-15 VITALS — BP 144/92 | Ht 64.5 in | Wt 147.0 lb

## 2020-01-15 DIAGNOSIS — M8589 Other specified disorders of bone density and structure, multiple sites: Secondary | ICD-10-CM

## 2020-01-15 DIAGNOSIS — Z780 Asymptomatic menopausal state: Secondary | ICD-10-CM

## 2020-01-15 DIAGNOSIS — N952 Postmenopausal atrophic vaginitis: Secondary | ICD-10-CM | POA: Diagnosis not present

## 2020-01-15 DIAGNOSIS — Z01419 Encounter for gynecological examination (general) (routine) without abnormal findings: Secondary | ICD-10-CM | POA: Diagnosis not present

## 2020-01-15 MED ORDER — ESTRADIOL 10 MCG VA TABS: 1.0000 | ORAL_TABLET | VAGINAL | 4 refills | Status: DC

## 2020-01-15 NOTE — Progress Notes (Signed)
Tonya Davis 02-Oct-1955 [id number]   History:    64 y.o. G0 Married. 2 adopted daughters 38 yo in [place] and 53 yo in [place].  VO:[id number] presenting for annual gyn exam   VEL:[id number], well on no HRT. No PMB. No pelvic pain. No pain with IC, better with coconut oil.  Would like to start on vaginal Estradiol.Breasts wnl. BMI 24.84. Fit and eating well. Ankylosing Spondylitis, doing the nutritional protocol. Health Labs with Fam MD.  Past medical history,surgical history, family history and social history were all reviewed and documented in the EPIC chart.  Gynecologic History No LMP recorded. Patient is postmenopausal.  Obstetric History OB History  Gravida Para Term Preterm AB Living  0            SAB TAB Ectopic Multiple Live Births             Obstetric Comments  2 adopted children     ROS: A ROS was performed and pertinent positives and negatives are included in the history.  GENERAL: No fevers or chills. HEENT: No change in vision, no earache, sore throat or sinus congestion. NECK: No pain or stiffness. CARDIOVASCULAR: No chest pain or pressure. No palpitations. PULMONARY: No shortness of breath, cough or wheeze. GASTROINTESTINAL: No abdominal pain, nausea, vomiting or diarrhea, melena or bright red blood per rectum. GENITOURINARY: No urinary frequency, urgency, hesitancy or dysuria. MUSCULOSKELETAL: No joint or muscle pain, no back pain, no recent trauma. DERMATOLOGIC: No rash, no itching, no lesions. ENDOCRINE: No polyuria, polydipsia, no heat or cold intolerance. No recent change in weight. HEMATOLOGICAL: No anemia or easy bruising or bleeding. NEUROLOGIC: No headache, seizures, numbness, tingling or weakness. PSYCHIATRIC: No depression, no loss of interest in normal activity or change in sleep pattern.     Exam:   BP (!) 144/92   Ht 5' 4.5" (1.638 m)   Wt 147 lb (66.7 kg)   BMI 24.84 kg/m   Body mass index is 24.84  kg/m.  General appearance : Well developed well nourished female. No acute distress HEENT: Eyes: no retinal hemorrhage or exudates,  Neck supple, trachea midline, no carotid bruits, no thyroidmegaly Lungs: Clear to auscultation, no rhonchi or wheezes, or rib retractions  Heart: Regular rate and rhythm, no murmurs or gallops Breast:Examined in sitting and supine position were symmetrical in appearance, no palpable masses or tenderness,  no skin retraction, no nipple inversion, no nipple discharge, no skin discoloration, no axillary or supraclavicular lymphadenopathy Abdomen: no palpable masses or tenderness, no rebound or guarding Extremities: no edema or skin discoloration or tenderness  Pelvic: Vulva: Normal             Vagina: No gross lesions or discharge  Cervix: No gross lesions or discharge  Uterus RV, normal size, shape and consistency, non-tender and mobile  Adnexa  Without masses or tenderness  Anus: Normal   Assessment/Plan:  64 y.o. female for annual exam   1. Well female exam with routine gynecological exam Normal gynecologic exam in menopause.  No indication for Pap test this year.  Breast exam normal.  Screening mammogram July 2021 was negative.  Colonoscopy 2016.  Good body mass index at 24.84. Continue with fitness and healthy nutrition.  Health labs with family physician.  2. Postmenopausal Well on no systemic hormone replacement therapy.  No postmenopausal bleeding.  3. Post-menopausal atrophic vaginitis Decision to start on estradiol tablets vaginally twice a week.  No contraindication.  Usage reviewed and prescription sent to pharmacy.  4. Osteopenia of multiple sites Will repeat BD at 3 yrs 02/2021.  Continue with vitamin D supplements, calcium intake of 1500 mg daily and regular weightbearing physical activities.  Other orders - bisoprolol-hydrochlorothiazide (ZIAC) 10-6.25 MG tablet; Take 1 tablet by mouth daily. - Estradiol 10 MCG TABS vaginal tablet; Place 1  tablet (10 mcg total) vaginally 2 (two) times a week.  Tonya Del MD, 4:24 PM 01/15/2020

## 2020-01-16 ENCOUNTER — Encounter: Payer: Self-pay | Admitting: Obstetrics & Gynecology

## 2020-01-21 ENCOUNTER — Ambulatory Visit: Payer: 59 | Admitting: Cardiology

## 2020-11-07 ENCOUNTER — Other Ambulatory Visit: Payer: Self-pay | Admitting: Obstetrics & Gynecology

## 2020-11-07 DIAGNOSIS — Z1231 Encounter for screening mammogram for malignant neoplasm of breast: Secondary | ICD-10-CM

## 2020-11-12 ENCOUNTER — Ambulatory Visit
Admission: RE | Admit: 2020-11-12 | Discharge: 2020-11-12 | Disposition: A | Discharge status: Home / Self Care | Payer: 59 | Source: Ambulatory Visit

## 2020-11-12 ENCOUNTER — Other Ambulatory Visit: Payer: Self-pay

## 2020-11-12 DIAGNOSIS — Z1231 Encounter for screening mammogram for malignant neoplasm of breast: Secondary | ICD-10-CM

## 2021-01-15 ENCOUNTER — Ambulatory Visit (INDEPENDENT_AMBULATORY_CARE_PROVIDER_SITE_OTHER): Payer: 59 | Admitting: Obstetrics & Gynecology

## 2021-01-15 ENCOUNTER — Encounter: Payer: Self-pay | Admitting: Obstetrics & Gynecology

## 2021-01-15 ENCOUNTER — Other Ambulatory Visit (HOSPITAL_COMMUNITY)
Admission: RE | Admit: 2021-01-15 | Discharge: 2021-01-15 | Disposition: A | Discharge status: Home / Self Care | Payer: 59 | Source: Ambulatory Visit | Attending: Obstetrics & Gynecology | Admitting: Obstetrics & Gynecology

## 2021-01-15 ENCOUNTER — Other Ambulatory Visit: Payer: Self-pay

## 2021-01-15 VITALS — BP 122/80 | HR 55 | Resp 16 | Ht 64.25 in | Wt 152.0 lb

## 2021-01-15 DIAGNOSIS — Z01419 Encounter for gynecological examination (general) (routine) without abnormal findings: Secondary | ICD-10-CM | POA: Diagnosis not present

## 2021-01-15 DIAGNOSIS — Z780 Asymptomatic menopausal state: Secondary | ICD-10-CM | POA: Diagnosis not present

## 2021-01-15 DIAGNOSIS — N952 Postmenopausal atrophic vaginitis: Secondary | ICD-10-CM

## 2021-01-15 DIAGNOSIS — M8589 Other specified disorders of bone density and structure, multiple sites: Secondary | ICD-10-CM | POA: Diagnosis not present

## 2021-01-15 NOTE — Progress Notes (Signed)
Tonya Davis 20-May-1955 [id number]   History:    65 y.o. G0 Married.  2 adopted daughters 8 yo in [place] and 61 yo in [place].   RP:  Established patient presenting for annual gyn exam    HPI: Postmenopause, well on no HRT.  No PMB.  No pelvic pain.  No pain with IC, better with coconut oil. Pap Neg 01/04/2018.  Pap reflex today.  Breasts wnl. Screen mammo 11/12/2020 Neg. BMI 25.89.  Fit and eating well. Ankylosing Spondylitis, doing the nutritional protocol.  Health Labs with Fam MD.  BD Osteopenia 03/02/2018 T-Score -1.5, will schedule here now.  [facility] 2016, will schedule with [facility].   Past medical history,surgical history, family history and social history were all reviewed and documented in the EPIC chart.  Gynecologic History No LMP recorded. Patient is postmenopausal.  Obstetric History OB History  Gravida Para Term Preterm AB Living  0            SAB IAB Ectopic Multiple Live Births             Obstetric Comments  2 adopted children     ROS: A ROS was performed and pertinent positives and negatives are included in the history.  GENERAL: No fevers or chills. HEENT: No change in vision, no earache, sore throat or sinus congestion. NECK: No pain or stiffness. CARDIOVASCULAR: No chest pain or pressure. No palpitations. PULMONARY: No shortness of breath, cough or wheeze. GASTROINTESTINAL: No abdominal pain, nausea, vomiting or diarrhea, melena or bright red blood per rectum. GENITOURINARY: No urinary frequency, urgency, hesitancy or dysuria. MUSCULOSKELETAL: No joint or muscle pain, no back pain, no recent trauma. DERMATOLOGIC: No rash, no itching, no lesions. ENDOCRINE: No polyuria, polydipsia, no heat or cold intolerance. No recent change in weight. HEMATOLOGICAL: No anemia or easy bruising or bleeding. NEUROLOGIC: No headache, seizures, numbness, tingling or weakness. PSYCHIATRIC: No depression, no loss of interest in normal activity or change in sleep pattern.      Exam:   BP 122/80   Pulse (!) 55   Resp 16   Ht 5' 4.25" (1.632 m)   Wt 152 lb (68.9 kg)   BMI 25.89 kg/m   Body mass index is 25.89 kg/m.  General appearance : Well developed well nourished female. No acute distress HEENT: Eyes: no retinal hemorrhage or exudates,  Neck supple, trachea midline, no carotid bruits, no thyroidmegaly Lungs: Clear to auscultation, no rhonchi or wheezes, or rib retractions  Heart: Regular rate and rhythm, no murmurs or gallops Breast:Examined in sitting and supine position were symmetrical in appearance, no palpable masses or tenderness,  no skin retraction, no nipple inversion, no nipple discharge, no skin discoloration, no axillary or supraclavicular lymphadenopathy Abdomen: no palpable masses or tenderness, no rebound or guarding Extremities: no edema or skin discoloration or tenderness  Pelvic: Vulva: Normal             Vagina: No gross lesions or discharge  Cervix: No gross lesions or discharge.  Pap reflex done.  Uterus  RV, normal size, shape and consistency, non-tender and mobile  Adnexa  Without masses or tenderness  Anus: Normal   Assessment/Plan:  65 y.o. female for annual exam   1. Encounter for routine gynecological examination with Papanicolaou smear of cervix Postmenopause, well on no HRT.  No PMB.  No pelvic pain.  No pain with IC, better with coconut oil. Pap Neg 01/04/2018.  Pap reflex today.  Breasts wnl. Screen mammo 11/12/2020 Neg.  BMI 25.89.  Fit and eating well. Ankylosing Spondylitis, doing the nutritional protocol.  Health Labs with Fam MD.  BD Osteopenia 03/02/2018 T-Score -1.5, will schedule here now.  [place] 2016, will schedule with [facility]. - Cytology - PAP( [name])  2. Postmenopausal Postmenopause, well on no HRT.  No PMB.  No pelvic pain.  No pain with IC, better with coconut oil.   3. Post-menopausal atrophic vaginitis No pain with IC, better with coconut oil.   4. Osteopenia of multiple sites BD Osteopenia  03/02/2018 T-Score -1.5, will schedule here now.  Vit D, Ca++ total 1.2-1.5 g/d, continue weight bearing physical activities. - DG Bone Density; Future  Other orders - bisoprolol-hydrochlorothiazide (ZIAC) 5-6.25 MG tablet; Take 1 tablet by mouth every morning.   Tonya Del MD, 4:21 PM 01/15/2021

## 2021-01-19 LAB — CYTOLOGY - PAP: Diagnosis: NEGATIVE

## 2021-02-02 ENCOUNTER — Other Ambulatory Visit: Payer: Self-pay | Admitting: Obstetrics & Gynecology

## 2021-02-02 ENCOUNTER — Ambulatory Visit (INDEPENDENT_AMBULATORY_CARE_PROVIDER_SITE_OTHER): Payer: 59

## 2021-02-02 ENCOUNTER — Other Ambulatory Visit: Payer: Self-pay

## 2021-02-02 DIAGNOSIS — Z780 Asymptomatic menopausal state: Secondary | ICD-10-CM | POA: Diagnosis not present

## 2021-02-02 DIAGNOSIS — Z13820 Encounter for screening for osteoporosis: Secondary | ICD-10-CM

## 2021-02-02 DIAGNOSIS — M8589 Other specified disorders of bone density and structure, multiple sites: Secondary | ICD-10-CM

## 2022-01-18 ENCOUNTER — Ambulatory Visit (HOSPITAL_COMMUNITY)
Admission: RE | Admit: 2022-01-18 | Discharge: 2022-01-18 | Disposition: A | Discharge status: Home / Self Care | Payer: 59 | Source: Ambulatory Visit | Attending: Physician Assistant | Admitting: Physician Assistant

## 2022-01-18 ENCOUNTER — Inpatient Hospital Stay (HOSPITAL_COMMUNITY)
Admission: RE | Admit: 2022-01-18 | Discharge: 2022-01-18 | Disposition: A | Discharge status: Home / Self Care | Payer: 59 | Source: Ambulatory Visit | Attending: Physician Assistant | Admitting: Physician Assistant

## 2022-01-18 VITALS — BP 166/102 | HR 50 | Ht 64.25 in | Wt 155.4 lb

## 2022-01-18 DIAGNOSIS — I480 Paroxysmal atrial fibrillation: Secondary | ICD-10-CM | POA: Diagnosis not present

## 2022-01-18 DIAGNOSIS — R001 Bradycardia, unspecified: Secondary | ICD-10-CM | POA: Diagnosis not present

## 2022-01-18 DIAGNOSIS — Z7901 Long term (current) use of anticoagulants: Secondary | ICD-10-CM | POA: Diagnosis not present

## 2022-01-18 DIAGNOSIS — I4891 Unspecified atrial fibrillation: Secondary | ICD-10-CM

## 2022-01-18 DIAGNOSIS — I10 Essential (primary) hypertension: Secondary | ICD-10-CM | POA: Insufficient documentation

## 2022-01-18 MED ORDER — APIXABAN 5 MG PO TABS: 5.0000 mg | ORAL_TABLET | Freq: Two times a day (BID) | ORAL | 3 refills | Status: DC

## 2022-01-18 NOTE — Progress Notes (Signed)
Primary Care Physician: Elias Else, MD Primary Cardiologist: none Primary Electrophysiologist: none Referring Physician: Dr Dennard Schaumann Tonya Davis is a 66 y.o. female with a history of HTN, HLD, ankylosing spondylitis, atrial fibrillation who presents for consultation in the [facility].  The patient was initially diagnosed with atrial fibrillation 12/31/21 after presenting to her PCP with symptoms of palpitations. Her Garmin smart watch also alerted that she had possible afib. ECG at that time showed SR but her Garmin strips do show true afib (personally reviewed today). Patient was started on Eliquis for a CHADS2VASC score of 2. Today, patient is in SR and feeling well. She denies significant snoring or alcohol use. She did have COVID about one month prior to the onset of her palpitations.   Today, she denies symptoms of chest pain, shortness of breath, orthopnea, PND, lower extremity edema, dizziness, presyncope, syncope, snoring, daytime somnolence, bleeding, or neurologic sequela. The patient is tolerating medications without difficulties and is otherwise without complaint today.    Atrial Fibrillation Risk Factors:  she does not have symptoms or diagnosis of sleep apnea. she does not have a history of rheumatic fever. she does have a history of alcohol use. The patient does have a history of early familial atrial fibrillation or other arrhythmias. Father had PPM.  she has a BMI of Body mass index is 26.47 kg/m.Marland Kitchen Filed Weights   01/18/22 0941  Weight: 70.5 kg    Family History  Problem Relation Age of Onset   Cancer Mother        lung - smoker   Hypertension Mother    Diabetes Father    Hypertension Father    Diabetes Paternal Aunt    Cancer Maternal Grandmother        colon   Hypertension Maternal Grandmother    Hypertension Maternal Grandfather    Hypertension Paternal Grandmother    Hypertension Paternal Grandfather      Atrial  Fibrillation Management history:  Previous antiarrhythmic drugs: none Previous cardioversions: none Previous ablations: none CHADS2VASC score: 2 Anticoagulation history: Eliquis   Past Medical History:  Diagnosis Date   Ankylosing spondylitis (HCC)    Hypertension    Past Surgical History:  Procedure Laterality Date   WRIST SURGERY     left     Current Outpatient Medications  Medication Sig Dispense Refill   apixaban (ELIQUIS) 2.5 MG TABS tablet Take 5 mg by mouth 2 (two) times daily.     bisoprolol-hydrochlorothiazide (ZIAC) 5-6.25 MG tablet Take 1 tablet by mouth every morning.     dextromethorphan-guaiFENesin (MUCINEX DM) 30-600 MG 12hr tablet Take 1 tablet by mouth 2 (two) times daily.     famotidine-calcium carbonate-magnesium hydroxide (PEPCID COMPLETE) 10-800-165 MG chewable tablet Chew 1 tablet by mouth as needed.     Multiple Vitamin (MULTIVITAMIN) capsule Take 1 capsule by mouth daily.     No current facility-administered medications for this encounter.    Allergies  Allergen Reactions   Cosentyx [Secukinumab] Other (See Comments)    Blisters on mouth/diarrhea    Social History   Socioeconomic History   Marital status: Married    Spouse name: Not on file   Number of children: Not on file   Years of education: Not on file   Highest education level: Not on file  Occupational History   Not on file  Tobacco Use   Smoking status: Never   Smokeless tobacco: Never  Vaping Use   Vaping Use: Never  used  Substance and Sexual Activity   Alcohol use: Not Currently    Comment: OCC   Drug use: Never   Sexual activity: Yes    Partners: Male    Birth control/protection: Post-menopausal    Comment: 1st intercourse- 75, partners- 89, married- 43 yrs   Other Topics Concern   Not on file  Social History Narrative   Not on file   Social Determinants of Health   Financial Resource Strain: Not on file  Food Insecurity: Not on file  Transportation Needs: Not on  file  Physical Activity: Not on file  Stress: Not on file  Social Connections: Not on file  Intimate Partner Violence: Not on file     ROS- All systems are reviewed and negative except as per the HPI above.  Physical Exam: Vitals:   01/18/22 0941  BP: (!) 166/102  Pulse: (!) 50  Weight: 70.5 kg  Height: 5' 4.25" (1.632 m)    GEN- The patient is a well appearing female, alert and oriented x 3 today.   Head- normocephalic, atraumatic Eyes-  Sclera clear, conjunctiva pink Ears- hearing intact Oropharynx- clear Neck- supple  Lungs- Clear to ausculation bilaterally, normal work of breathing Heart- Regular rate and rhythm, bradycardia, no murmurs, rubs or gallops  GI- soft, NT, ND, + BS Extremities- no clubbing, cyanosis, or edema MS- no significant deformity or atrophy Skin- no rash or lesion Psych- euthymic mood, full affect Neuro- strength and sensation are intact  Wt Readings from Last 3 Encounters:  01/18/22 70.5 kg  01/15/21 68.9 kg  01/15/20 66.7 kg    EKG today demonstrates  SB Vent. rate 50 BPM PR interval 170 ms QRS duration 84 ms QT/QTcB 450/410 ms   Epic records are reviewed at length today  CHA2DS2-VASc Score = 2  The patient's score is based upon: CHF History: 0 HTN History: 1 Diabetes History: 0 Stroke History: 0 Vascular Disease History: 0 Age Score: 0 Gender Score: 1       ASSESSMENT AND PLAN: 1. Paroxysmal Atrial Fibrillation (ICD10:  I48.0) The patient's CHA2DS2-VASc score is 2, indicating a 2.2% annual risk of stroke.   General education about afib provided and questions answered. We also discussed her stroke risk and the risks and benefits of anticoagulation. Will have her wear a 2 week Zio monitor to better quantify afib burden and rate control.   Check echocardiogram  Continue Eliquis 5 mg BID Continue bisoprolol-HCTZ 5-6.25 mg daily Will not increase AV nodal agent with baseline bradycardia today.   2. HTN Elevated today,  typically better controlled. Will reassess at follow up.   Follow up in the AF clinic in 3-4 weeks.    [facility] [address] [place], [place] [address] [phone] 01/18/2022 10:10 AM

## 2022-01-25 ENCOUNTER — Ambulatory Visit (HOSPITAL_COMMUNITY)
Admission: RE | Admit: 2022-01-25 | Discharge: 2022-01-25 | Disposition: A | Discharge status: Home / Self Care | Payer: 59 | Source: Ambulatory Visit | Attending: Family Medicine | Admitting: Family Medicine

## 2022-01-25 DIAGNOSIS — I10 Essential (primary) hypertension: Secondary | ICD-10-CM | POA: Insufficient documentation

## 2022-01-25 DIAGNOSIS — I480 Paroxysmal atrial fibrillation: Secondary | ICD-10-CM | POA: Diagnosis not present

## 2022-01-25 DIAGNOSIS — E785 Hyperlipidemia, unspecified: Secondary | ICD-10-CM | POA: Diagnosis not present

## 2022-01-25 DIAGNOSIS — I081 Rheumatic disorders of both mitral and tricuspid valves: Secondary | ICD-10-CM | POA: Diagnosis not present

## 2022-01-25 LAB — ECHOCARDIOGRAM COMPLETE
Area-P 1/2: 3.91 cm2
S' Lateral: 2.9 cm

## 2022-01-27 ENCOUNTER — Encounter (HOSPITAL_COMMUNITY): Payer: Self-pay | Admitting: *Deleted

## 2022-02-04 ENCOUNTER — Telehealth (HOSPITAL_COMMUNITY): Payer: Self-pay | Admitting: *Deleted

## 2022-02-04 ENCOUNTER — Other Ambulatory Visit: Payer: Self-pay | Admitting: Obstetrics & Gynecology

## 2022-02-04 DIAGNOSIS — Z1231 Encounter for screening mammogram for malignant neoplasm of breast: Secondary | ICD-10-CM

## 2022-02-04 MED ORDER — AMLODIPINE BESYLATE 2.5 MG PO TABS: 2.5000 mg | ORAL_TABLET | Freq: Every day | ORAL | 3 refills | Status: AC

## 2022-02-04 NOTE — Telephone Encounter (Signed)
Pt BP continues to be elevated at home BP 140-150/90-100 Discussed with Tonya Loa PA will start amlodipine 2.5mg  once a day follow up as scheduled early January. Pt in agreement.

## 2022-02-09 NOTE — Addendum Note (Signed)
Encounter addended by: Shona Simpson, RN on: 02/09/2022 9:55 AM  Actions taken: Imaging Exam ended

## 2022-02-17 ENCOUNTER — Encounter (HOSPITAL_COMMUNITY): Payer: Self-pay | Admitting: Physician Assistant

## 2022-02-17 ENCOUNTER — Ambulatory Visit (HOSPITAL_COMMUNITY)
Admission: RE | Admit: 2022-02-17 | Discharge: 2022-02-17 | Disposition: A | Discharge status: Home / Self Care | Payer: 59 | Source: Ambulatory Visit | Attending: Physician Assistant | Admitting: Physician Assistant

## 2022-02-17 VITALS — BP 136/88 | HR 51 | Ht 64.25 in | Wt 155.2 lb

## 2022-02-17 DIAGNOSIS — E785 Hyperlipidemia, unspecified: Secondary | ICD-10-CM | POA: Diagnosis not present

## 2022-02-17 DIAGNOSIS — M459 Ankylosing spondylitis of unspecified sites in spine: Secondary | ICD-10-CM | POA: Insufficient documentation

## 2022-02-17 DIAGNOSIS — I480 Paroxysmal atrial fibrillation: Secondary | ICD-10-CM | POA: Diagnosis not present

## 2022-02-17 DIAGNOSIS — I10 Essential (primary) hypertension: Secondary | ICD-10-CM | POA: Diagnosis not present

## 2022-02-17 DIAGNOSIS — Z7901 Long term (current) use of anticoagulants: Secondary | ICD-10-CM | POA: Diagnosis not present

## 2022-02-17 DIAGNOSIS — Z79899 Other long term (current) drug therapy: Secondary | ICD-10-CM | POA: Diagnosis not present

## 2022-02-17 NOTE — Progress Notes (Signed)
Primary Care Physician: Maury Dus, MD Primary Cardiologist: none Primary Electrophysiologist: none Referring Physician: Dr Dierdre Highman Tonya Davis is a 67 y.o. female with a history of HTN, HLD, ankylosing spondylitis, atrial fibrillation who presents for follow up in the [facility].  The patient was initially diagnosed with atrial fibrillation 12/31/21 after presenting to her PCP with symptoms of palpitations. Her Garmin smart watch also alerted that she had possible afib. ECG at that time showed SR but her Garmin strips do show true afib (personally reviewed). Patient was started on Eliquis for a CHADS2VASC score of 2. She denies significant snoring or alcohol use. She did have COVID about one month prior to the onset of her palpitations.   On follow up today, patient reports that her palpitations have steadily improved. They are less frequent and not as noticeable. Zio monitor showed 1% afib burden with several brief episodes each day. No bleeding issues on anticoagulation.   Today, she denies symptoms of chest pain, shortness of breath, orthopnea, PND, lower extremity edema, dizziness, presyncope, syncope, snoring, daytime somnolence, bleeding, or neurologic sequela. The patient is tolerating medications without difficulties and is otherwise without complaint today.    Atrial Fibrillation Risk Factors:  she does not have symptoms or diagnosis of sleep apnea. she does not have a history of rheumatic fever. she does have a history of alcohol use. The patient does have a history of early familial atrial fibrillation or other arrhythmias. Father had PPM.  she has a BMI of Body mass index is 26.43 kg/m.Marland Kitchen Filed Weights   02/17/22 1009  Weight: 70.4 kg    Family History  Problem Relation Age of Onset   Cancer Mother        lung - smoker   Hypertension Mother    Diabetes Father    Hypertension Father    Diabetes Paternal Aunt    Cancer Maternal  Grandmother        colon   Hypertension Maternal Grandmother    Hypertension Maternal Grandfather    Hypertension Paternal Grandmother    Hypertension Paternal Grandfather      Atrial Fibrillation Management history:  Previous antiarrhythmic drugs: none Previous cardioversions: none Previous ablations: none CHADS2VASC score: 2 Anticoagulation history: Eliquis   Past Medical History:  Diagnosis Date   Ankylosing spondylitis ([place])    Hypertension    Past Surgical History:  Procedure Laterality Date   WRIST SURGERY     left     Current Outpatient Medications  Medication Sig Dispense Refill   amLODipine (NORVASC) 2.5 MG tablet Take 1 tablet (2.5 mg total) by mouth daily. 30 tablet 3   apixaban (ELIQUIS) 5 MG TABS tablet Take 1 tablet (5 mg total) by mouth 2 (two) times daily. 60 tablet 3   bisoprolol-hydrochlorothiazide (ZIAC) 5-6.25 MG tablet Take 1 tablet by mouth every morning.     famotidine-calcium carbonate-magnesium hydroxide (PEPCID COMPLETE) 10-800-165 MG chewable tablet Chew 1 tablet by mouth as needed.     Multiple Vitamin (MULTIVITAMIN) capsule Take 1 capsule by mouth daily.     No current facility-administered medications for this encounter.    Allergies  Allergen Reactions   Cosentyx [Secukinumab] Other (See Comments)    Blisters on mouth/diarrhea    Social History   Socioeconomic History   Marital status: Married    Spouse name: Not on file   Number of children: Not on file   Years of education: Not on file   Highest  education level: Not on file  Occupational History   Not on file  Tobacco Use   Smoking status: Never   Smokeless tobacco: Never   Tobacco comments:    Never smoke 02/17/22  Vaping Use   Vaping Use: Never used  Substance and Sexual Activity   Alcohol use: Not Currently    Comment: OCC   Drug use: Never   Sexual activity: Yes    Partners: Male    Birth control/protection: Post-menopausal    Comment: 1st intercourse- 30,  partners- 68, married- 72 yrs   Other Topics Concern   Not on file  Social History Narrative   Not on file   Social Determinants of Health   Financial Resource Strain: Not on file  Food Insecurity: Not on file  Transportation Needs: Not on file  Physical Activity: Not on file  Stress: Not on file  Social Connections: Not on file  Intimate Partner Violence: Not on file     ROS- All systems are reviewed and negative except as per the HPI above.  Physical Exam: Vitals:   02/17/22 1009  BP: 136/88  Pulse: (!) 51  Weight: 70.4 kg  Height: 5' 4.25" (1.632 m)     GEN- The patient is a well appearing female, alert and oriented x 3 today.   HEENT-head normocephalic, atraumatic, sclera clear, conjunctiva pink, hearing intact, trachea midline. Lungs- Clear to ausculation bilaterally, normal work of breathing Heart- Regular rate and rhythm, no murmurs, rubs or gallops  GI- soft, NT, ND, + BS Extremities- no clubbing, cyanosis, or edema MS- no significant deformity or atrophy Skin- no rash or lesion Psych- euthymic mood, full affect Neuro- strength and sensation are intact   Wt Readings from Last 3 Encounters:  02/17/22 70.4 kg  01/18/22 70.5 kg  01/15/21 68.9 kg    EKG today demonstrates  SB Vent. rate 51 BPM PR interval 180 ms QRS duration 86 ms QT/QTcB 442/407 ms   Epic records are reviewed at length today  CHA2DS2-VASc Score = 2  The patient's score is based upon: CHF History: 0 HTN History: 1 Diabetes History: 0 Stroke History: 0 Vascular Disease History: 0 Age Score: 0 Gender Score: 1       ASSESSMENT AND PLAN: 1. Paroxysmal Atrial Fibrillation (ICD10:  I48.0) The patient's CHA2DS2-VASc score is 2, indicating a 2.2% annual risk of stroke.   Zio monitor showed 1% afib burden with avg HR 109 bpm. She had several brief episodes each day. We discussed rhythm control options including AAD and ablation. She does not feel her symptoms at this time warrant  additional rhythm control, especially since her symptoms are becoming less frequent. Will pursue watchful waiting for now. She has a smart watch to track her afib burden at home.   Continue Eliquis 5 mg BID Continue bisoprolol-HCTZ 5-6.25 mg daily Will not increase AV nodal agent with baseline bradycardia.  2. HTN Stable after starting amlodipine. No changes today.   Follow up in the AF clinic in 3 months.    [facility] [address] [place], [place] [address] [phone] 02/17/2022 10:19 AM

## 2022-04-01 ENCOUNTER — Ambulatory Visit
Admission: RE | Admit: 2022-04-01 | Discharge: 2022-04-01 | Disposition: A | Discharge status: Home / Self Care | Payer: 59 | Source: Ambulatory Visit | Attending: Obstetrics & Gynecology | Admitting: Obstetrics & Gynecology

## 2022-04-01 DIAGNOSIS — Z1231 Encounter for screening mammogram for malignant neoplasm of breast: Secondary | ICD-10-CM

## 2022-05-19 ENCOUNTER — Encounter (HOSPITAL_COMMUNITY): Payer: Self-pay | Admitting: Physician Assistant

## 2022-05-19 ENCOUNTER — Ambulatory Visit (HOSPITAL_COMMUNITY)
Admission: RE | Admit: 2022-05-19 | Discharge: 2022-05-19 | Disposition: A | Discharge status: Home / Self Care | Payer: 59 | Source: Ambulatory Visit | Attending: Physician Assistant | Admitting: Physician Assistant

## 2022-05-19 VITALS — BP 134/88 | HR 52 | Ht 64.25 in | Wt 155.4 lb

## 2022-05-19 DIAGNOSIS — I10 Essential (primary) hypertension: Secondary | ICD-10-CM | POA: Insufficient documentation

## 2022-05-19 DIAGNOSIS — I480 Paroxysmal atrial fibrillation: Secondary | ICD-10-CM | POA: Insufficient documentation

## 2022-05-19 DIAGNOSIS — Z7901 Long term (current) use of anticoagulants: Secondary | ICD-10-CM | POA: Insufficient documentation

## 2022-05-19 DIAGNOSIS — R002 Palpitations: Secondary | ICD-10-CM | POA: Diagnosis not present

## 2022-05-19 DIAGNOSIS — R5383 Other fatigue: Secondary | ICD-10-CM | POA: Insufficient documentation

## 2022-05-19 DIAGNOSIS — Z8616 Personal history of COVID-19: Secondary | ICD-10-CM | POA: Diagnosis not present

## 2022-05-19 DIAGNOSIS — E785 Hyperlipidemia, unspecified: Secondary | ICD-10-CM | POA: Insufficient documentation

## 2022-05-19 DIAGNOSIS — Z79899 Other long term (current) drug therapy: Secondary | ICD-10-CM | POA: Insufficient documentation

## 2022-05-19 NOTE — Progress Notes (Signed)
Primary Care Physician: Maury Dus, MD (Inactive) Primary Cardiologist: none Primary Electrophysiologist: none Referring Physician: Dr Dierdre Highman Tonya Davis is a 67 y.o. female with a history of HTN, HLD, ankylosing spondylitis, atrial fibrillation who presents for follow up in the [facility].  The patient was initially diagnosed with atrial fibrillation 12/31/21 after presenting to her PCP with symptoms of palpitations. Her Garmin smart watch also alerted that she had possible afib. ECG at that time showed SR but her Garmin strips do show true afib (personally reviewed). Patient was started on Eliquis for a CHADS2VASC score of 2. She denies significant snoring or alcohol use. She did have COVID about one month prior to the onset of her palpitations. She wore a Zio monitor which showed 1% afib burden.   On follow up today, patient reports that she has had frequent palpitations, at least daily. The episodes don't last long but she is very fatigued afterwards. She is very anxious about these episodes. There does not seem to be any specific trigger.   Today, she denies symptoms of chest pain, shortness of breath, orthopnea, PND, lower extremity edema, dizziness, presyncope, syncope, snoring, daytime somnolence, bleeding, or neurologic sequela. The patient is tolerating medications without difficulties and is otherwise without complaint today.    Atrial Fibrillation Risk Factors:  she does not have symptoms or diagnosis of sleep apnea. she does not have a history of rheumatic fever. she does have a history of alcohol use. The patient does have a history of early familial atrial fibrillation or other arrhythmias. Father had PPM.  she has a BMI of Body mass index is 26.47 kg/m.Marland Kitchen Filed Weights   05/19/22 1002  Weight: 70.5 kg    Family History  Problem Relation Age of Onset   Cancer Mother        lung - smoker   Hypertension Mother    Diabetes Father     Hypertension Father    Diabetes Paternal Aunt    Cancer Maternal Grandmother        colon   Hypertension Maternal Grandmother    Hypertension Maternal Grandfather    Hypertension Paternal Grandmother    Hypertension Paternal Grandfather      Atrial Fibrillation Management history:  Previous antiarrhythmic drugs: none Previous cardioversions: none Previous ablations: none CHADS2VASC score: 2 Anticoagulation history: Eliquis   Past Medical History:  Diagnosis Date   Ankylosing spondylitis    Hypertension    Past Surgical History:  Procedure Laterality Date   WRIST SURGERY     left     Current Outpatient Medications  Medication Sig Dispense Refill   amLODipine (NORVASC) 2.5 MG tablet Take 1 tablet (2.5 mg total) by mouth daily. 30 tablet 3   apixaban (ELIQUIS) 5 MG TABS tablet Take 1 tablet (5 mg total) by mouth 2 (two) times daily. 60 tablet 3   bisoprolol-hydrochlorothiazide (ZIAC) 5-6.25 MG tablet Take 1 tablet by mouth every morning.     famotidine-calcium carbonate-magnesium hydroxide (PEPCID COMPLETE) 10-800-165 MG chewable tablet Chew 1 tablet by mouth as needed.     Multiple Vitamin (MULTIVITAMIN) capsule Take 1 capsule by mouth daily.     No current facility-administered medications for this encounter.    Allergies  Allergen Reactions   Cosentyx [Secukinumab] Other (See Comments)    Blisters on mouth/diarrhea    Social History   Socioeconomic History   Marital status: Married    Spouse name: Not on file   Number of  children: Not on file   Years of education: Not on file   Highest education level: Not on file  Occupational History   Not on file  Tobacco Use   Smoking status: Never   Smokeless tobacco: Never   Tobacco comments:    Never smoke 02/17/22  Vaping Use   Vaping Use: Never used  Substance and Sexual Activity   Alcohol use: Not Currently    Comment: OCC   Drug use: Never   Sexual activity: Yes    Partners: Male    Birth  control/protection: Post-menopausal    Comment: 1st intercourse- 20, partners- 2, married- 58 yrs   Other Topics Concern   Not on file  Social History Narrative   Not on file   Social Determinants of Health   Financial Resource Strain: Not on file  Food Insecurity: Not on file  Transportation Needs: Not on file  Physical Activity: Not on file  Stress: Not on file  Social Connections: Not on file  Intimate Partner Violence: Not on file     ROS- All systems are reviewed and negative except as per the HPI above.  Physical Exam: Vitals:   05/19/22 1002  BP: 134/88  Pulse: (!) 52  Weight: 70.5 kg  Height: 5' 4.25" (1.632 m)    GEN- The patient is a well appearing female, alert and oriented x 3 today.   HEENT-head normocephalic, atraumatic, sclera clear, conjunctiva pink, hearing intact, trachea midline. Lungs- Clear to ausculation bilaterally, normal work of breathing Heart- Regular rate and rhythm, no murmurs, rubs or gallops  GI- soft, NT, ND, + BS Extremities- no clubbing, cyanosis, or edema MS- no significant deformity or atrophy Skin- no rash or lesion Psych- euthymic mood, full affect Neuro- strength and sensation are intact   Wt Readings from Last 3 Encounters:  05/19/22 70.5 kg  02/17/22 70.4 kg  01/18/22 70.5 kg    EKG today demonstrates  SB Vent. rate 52 BPM PR interval 192 ms QRS duration 84 ms QT/QTcB 420/390 ms  Echo 01/25/22  1. Left ventricular ejection fraction, by estimation, is 55 to 60%. The  left ventricle has normal function. The left ventricle has no regional  wall motion abnormalities. Left ventricular diastolic parameters are  consistent with Grade I diastolic dysfunction (impaired relaxation).   2. Right ventricular systolic function is normal. The right ventricular  size is normal. Tricuspid regurgitation signal is inadequate for assessing  PA pressure.   3. The mitral valve is normal in structure. Trivial mitral valve   regurgitation. No evidence of mitral stenosis.   4. The aortic valve is tricuspid. Aortic valve regurgitation is not  visualized. No aortic stenosis is present.   5. The inferior vena cava is normal in size with greater than 50%  respiratory variability, suggesting right atrial pressure of 3 mmHg.   Epic records are reviewed at length today  CHA2DS2-VASc Score = 2  The patient's score is based upon: CHF History: 0 HTN History: 1 Diabetes History: 0 Stroke History: 0 Vascular Disease History: 0 Age Score: 0 Gender Score: 1       ASSESSMENT AND PLAN: 1. Paroxysmal Atrial Fibrillation (ICD10:  I48.0) The patient's CHA2DS2-VASc score is 2, indicating a 2.2% annual risk of stroke.   Zio monitor showed 1% afib burden. She continues to have frequent palpitations and fatigue.  We discussed rhythm control options again including AAD (flecainide) and ablation. She is agreeable to EP consult to discuss ablation. She would prefer not to  start additional mediations at this time.  Continue Eliquis 5 mg BID Continue bisoprolol-HCTZ 5-6.25 mg daily Will not increase AV nodal agent with baseline bradycardia. Apple Watch for home monitoring.   2. HTN Stable, no changes today.   Follow up with EP to establish care/discuss ablation.    [facility] [address] [place], [place] [address] [phone] 05/19/2022 10:11 AM

## 2022-05-21 ENCOUNTER — Other Ambulatory Visit (HOSPITAL_COMMUNITY): Payer: Self-pay | Admitting: Physician Assistant

## 2022-06-16 ENCOUNTER — Ambulatory Visit: Payer: 59 | Attending: Cardiovascular Disease | Admitting: Cardiovascular Disease

## 2022-06-16 VITALS — BP 122/84 | HR 52 | Ht 64.0 in | Wt 155.2 lb

## 2022-06-16 DIAGNOSIS — I4891 Unspecified atrial fibrillation: Secondary | ICD-10-CM

## 2022-06-16 DIAGNOSIS — I480 Paroxysmal atrial fibrillation: Secondary | ICD-10-CM | POA: Diagnosis not present

## 2022-06-16 NOTE — Progress Notes (Signed)
Electrophysiology Office Note:    Date:  06/16/2022   ID:  Tonya Davis, DOB 21-May-1955, MRN [id number]  PCP:  Elias Else, MD (Inactive)   [facility] Providers Cardiologist:  None     Referring MD: Danice Goltz, PA   History of Present Illness:    Tonya Davis is a 67 y.o. female with a hx listed below, significant for tension, hyperlipidemia, ankylosing spondylitis, and atrial fibrillation referred for arrhythmia management.  She was diagnosed with atrial fibrillation in November 2023 after presenting to her PCP with palpitations.  At that time, she was recovering from COVID infection. Her Garmin smart watch has generated multiple alerts showing atrial fibrillation. These are scanned in under ECGs.   She replaced her arm and watch with a Apple watch.  She has not had any detections of of A-fib to her knowledge in some time.  Her burden is reported as less than 2%.  She does occasionally have brief runs of palpitations, but these lasted less than a minute and are resolved by the time she can check an EKG.  These appear to be occurring less and less frequently.    Past Medical History:  Diagnosis Date   Ankylosing spondylitis (HCC)    Hypertension     Past Surgical History:  Procedure Laterality Date   WRIST SURGERY     left     Current Medications: Current Meds  Medication Sig   acetaminophen (TYLENOL) 325 MG tablet Take 325 mg by mouth as needed for headache.   bisoprolol-hydrochlorothiazide (ZIAC) 5-6.25 MG tablet Take 1 tablet by mouth every morning.   cetirizine (ZYRTEC ALLERGY) 10 MG tablet Take 5 mg by mouth as needed for allergies.   ELIQUIS 5 MG TABS tablet TAKE ONE TABLET BY MOUTH TWICE DAILY   famotidine-calcium carbonate-magnesium hydroxide (PEPCID COMPLETE) 10-800-165 MG chewable tablet Chew 1 tablet by mouth as needed.     Allergies:   Cosentyx [secukinumab]   Social and Family History: Reviewed in Epic  ROS:   Please see the  history of present illness.    All other systems reviewed and are negative.  EKGs/Labs/Other Studies Reviewed Today:    Echocardiogram:  TTE 01/25/2022  1. Left ventricular ejection fraction, by estimation, is 55 to 60%. The  left ventricle has normal function. The left ventricle has no regional  wall motion abnormalities. Left ventricular diastolic parameters are  consistent with Grade I diastolic dysfunction (impaired relaxation).   2. Right ventricular systolic function is normal. The right ventricular  size is normal. Tricuspid regurgitation signal is inadequate for assessing  PA pressure.   3. The mitral valve is normal in structure. Trivial mitral valve  regurgitation. No evidence of mitral stenosis.   4. The aortic valve is tricuspid. Aortic valve regurgitation is not  visualized. No aortic stenosis is present.   5. The inferior vena cava is normal in size with greater than 50%  respiratory variability, suggesting right atrial pressure of 3 mmHg.    Monitors:  Zio 01/2022 - my interpretation Wear time 14 days Sinus rhythm, HR 38-121, avg 57 1% AF burden, longest 12 minutes. Rates 109 on average, max 165 Symptoms of skipped beats correlated with PACs and AF  Stress testing:    Advanced imaging:   EKG:  Last EKG results: today -- sinus bradycardia, 52 bpm  I personally reviewed the ECG strips from her Garmin consumer monitor. They showed sinus rhythm with PACs as well as atrial fibrillation.  Recent  Labs: No results found for requested labs within last 365 days.     Physical Exam:    VS:  BP 122/84   Pulse (!) 52   Ht 5\' 4"  (1.626 m)   Wt 155 lb 3.2 oz (70.4 kg)   SpO2 100%   BMI 26.64 kg/m     Wt Readings from Last 3 Encounters:  06/16/22 155 lb 3.2 oz (70.4 kg)  05/19/22 155 lb 6.4 oz (70.5 kg)  02/17/22 155 lb 3.2 oz (70.4 kg)     GEN: Well nourished, well developed in no acute distress CARDIAC: RRR, no murmurs, rubs, gallops RESPIRATORY:   Normal work of breathing MUSCULOSKELETAL: no edema    ASSESSMENT & PLAN:    Paroxysmal atrial fibrillation Episodes have been brief but highly symptomatic She has not had symptoms recently, however. Will schedule her for ablation. She will continue to monitor. If she does not have any recurrence of AF between now and follow-up, we may cancel the procedure and continue to monitor.  We discussed the indication, rationale, logistics, anticipated benefits, and potential risks of the ablation procedure including but not limited to -- bleed at the groin access site, chest pain, damage to nearby organs such as the diaphragm, lungs, or esophagus, need for a drainage tube, or prolonged hospitalization. I explained that the risk for stroke, heart attack, need for open chest surgery, or even death is very low but not zero. she  expressed understanding and wishes to proceed.   Secondary hypercoagulable state CHA2DS2-VASc is 2 Continue Eliquis 5 mg twice daily  Hypertension BP reasonably controlled. No changes today    Medication Adjustments/Labs and Tests Ordered: Current medicines are reviewed at length with the patient today.  Concerns regarding medicines are outlined above.  Orders Placed This Encounter  Procedures   EKG 12-Lead   No orders of the defined types were placed in this encounter.  Assessment and plan communicated with referring provider.  Signed, Maurice Small, MD  06/16/2022 10:33 AM    [facility]

## 2022-06-16 NOTE — Patient Instructions (Signed)
Medication Instructions:  Your physician recommends that you continue on your current medications as directed. Please refer to the Current Medication list given to you today. *If you need a refill on your cardiac medications before your next appointment, please call your pharmacy*   Lab Work: CBC and BMET will be completed at your next appointment with Dr Nelly Laurence If you have labs (blood work) drawn today and your tests are completely normal, you will receive your results only by: MyChart Message (if you have MyChart) OR A paper copy in the mail If you have any lab test that is abnormal or we need to change your treatment, we will call you to review the results.   Testing/Procedures: Atrial fibrillation ablation  Your physician has recommended that you have an ablation. Catheter ablation is a medical procedure used to treat some cardiac arrhythmias (irregular heartbeats). During catheter ablation, a long, thin, flexible tube is put into a blood vessel in your groin (upper thigh), or neck. This tube is called an ablation catheter. It is then guided to your heart through the blood vessel. Radio frequency waves destroy small areas of heart tissue where abnormal heartbeats may cause an arrhythmia to start. Please see the instruction sheet given to you today.   Follow-Up: At [facility], you and your health needs are our priority.  As part of our continuing mission to provide you with exceptional heart care, we have created designated Provider Care Teams.  These Care Teams include your primary Cardiologist (physician) and Advanced Practice Providers (APPs -  Physician Assistants and Nurse Practitioners) who all work together to provide you with the care you need, when you need it.  We recommend signing up for the patient portal called "MyChart".  Sign up information is provided on this After Visit Summary.  MyChart is used to connect with patients for Virtual Visits (Telemedicine).  Patients  are able to view lab/test results, encounter notes, upcoming appointments, etc.  Non-urgent messages can be sent to your provider as well.   To learn more about what you can do with MyChart, go to [email or web address].    Your next appointment:   6 month(s)  Provider:   York Pellant, MD

## 2022-08-02 ENCOUNTER — Encounter: Payer: Self-pay | Admitting: Cardiovascular Disease

## 2022-09-16 ENCOUNTER — Other Ambulatory Visit (HOSPITAL_COMMUNITY): Payer: Self-pay | Admitting: Physician Assistant

## 2022-11-23 ENCOUNTER — Ambulatory Visit: Payer: Medicare Other

## 2022-11-23 ENCOUNTER — Encounter: Payer: Self-pay | Admitting: Cardiovascular Disease

## 2022-11-23 ENCOUNTER — Ambulatory Visit: Payer: Medicare Other | Attending: Cardiovascular Disease | Admitting: Cardiovascular Disease

## 2022-11-23 VITALS — BP 120/64 | HR 53 | Ht 64.0 in | Wt 150.0 lb

## 2022-11-23 DIAGNOSIS — I480 Paroxysmal atrial fibrillation: Secondary | ICD-10-CM

## 2022-11-23 NOTE — Patient Instructions (Signed)
Medication Instructions:  Your physician recommends that you continue on your current medications as directed. Please refer to the Current Medication list given to you today. *If you need a refill on your cardiac medications before your next appointment, please call your pharmacy*   Lab Work: CBC and BMET today If you have labs (blood work) drawn today and your tests are completely normal, you will receive your results only by: MyChart Message (if you have MyChart) OR A paper copy in the mail If you have any lab test that is abnormal or we need to change your treatment, we will call you to review the results.   Testing/Procedures: AF ablation - see instruction letter   Follow-Up: At [facility], you and your health needs are our priority.  As part of our continuing mission to provide you with exceptional heart care, we have created designated Provider Care Teams.  These Care Teams include your primary Cardiologist (physician) and Advanced Practice Providers (APPs -  Physician Assistants and Nurse Practitioners) who all work together to provide you with the care you need, when you need it.  We recommend signing up for the patient portal called "MyChart".  Sign up information is provided on this After Visit Summary.  MyChart is used to connect with patients for Virtual Visits (Telemedicine).  Patients are able to view lab/test results, encounter notes, upcoming appointments, etc.  Non-urgent messages can be sent to your provider as well.   To learn more about what you can do with MyChart, go to [email or web address].    Your next appointment:   We will schedule follow up for you after your procedure  Provider:   York Pellant, MD

## 2022-11-23 NOTE — H&P (View-Only) (Signed)
Electrophysiology Office Note:    Date:  11/23/2022   ID:  Tonya Davis, DOB 07-06-1955, MRN [id number]  PCP:  Elias Else, MD (Inactive)   [facility] Providers Cardiologist:  None Electrophysiologist:  Maurice Small, MD     Referring MD: No ref. provider found   History of Present Illness:    Tonya Davis is a 67 y.o. female with a hx listed below, significant for tension, hyperlipidemia, ankylosing spondylitis, and atrial fibrillation referred for arrhythmia management.  She was diagnosed with atrial fibrillation in November 2023 after presenting to her PCP with palpitations.  At that time, she was recovering from COVID infection. Her Garmin smart watch has generated multiple alerts showing atrial fibrillation. These are scanned in under ECGs.   At her last visit, we scheduled her for A-fib ablation.  At that time, she had not had any episodes for weeks or months.  However, she has had a few episodes since her last visit.  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  She feels prone to recurrences of A-fib when she has minor illnesses.     EKGs/Labs/Other Studies Reviewed Today:    Echocardiogram:  TTE 01/25/2022  1. Left ventricular ejection fraction, by estimation, is 55 to 60%. The  left ventricle has normal function. The left ventricle has no regional  wall motion abnormalities. Left ventricular diastolic parameters are  consistent with Grade I diastolic dysfunction (impaired relaxation).   2. Right ventricular systolic function is normal. The right ventricular  size is normal. Tricuspid regurgitation signal is inadequate for assessing  PA pressure.   3. The mitral valve is normal in structure. Trivial mitral valve  regurgitation. No evidence of mitral stenosis.   4. The aortic valve is tricuspid. Aortic valve regurgitation is not  visualized. No aortic stenosis is present.   5. The inferior vena cava is normal in size with  greater than 50%  respiratory variability, suggesting right atrial pressure of 3 mmHg.    Monitors:  Zio 01/2022 - my interpretation Wear time 14 days Sinus rhythm, HR 38-121, avg 57 1% AF burden, longest 12 minutes. Rates 109 on average, max 165 Symptoms of skipped beats correlated with PACs and AF  Stress testing:    Advanced imaging:   EKG:  Last EKG results: today -- sinus bradycardia, 52 bpm  I personally reviewed the ECG strips from her Garmin consumer monitor. They showed sinus rhythm with PACs as well as atrial fibrillation.  Recent Labs: No results found for requested labs within last 365 days.     Physical Exam:    VS:  BP 120/64 (BP Location: Left Arm, Patient Position: Sitting, Cuff Size: Normal)   Pulse (!) 53   Ht 5\' 4"  (1.626 m)   Wt 150 lb (68 kg)   SpO2 98%   BMI 25.75 kg/m     Wt Readings from Last 3 Encounters:  11/23/22 150 lb (68 kg)  06/16/22 155 lb 3.2 oz (70.4 kg)  05/19/22 155 lb 6.4 oz (70.5 kg)     GEN: Well nourished, well developed in no acute distress CARDIAC: RRR, no murmurs, rubs, gallops RESPIRATORY:  Normal work of breathing MUSCULOSKELETAL: no edema    ASSESSMENT & PLAN:    Paroxysmal atrial fibrillation Episodes have been brief but highly symptomatic She has had a few episodes of AF documented on Kardia recently Will proceed with ablation.  We discussed the indication, rationale, logistics, anticipated benefits, and potential risks  of the ablation procedure including but not limited to -- bleed at the groin access site, chest pain, damage to nearby organs such as the diaphragm, lungs, or esophagus, need for a drainage tube, or prolonged hospitalization. I explained that the risk for stroke, heart attack, need for open chest surgery, or even death is very low but not zero. she  expressed understanding and wishes to proceed.   Secondary hypercoagulable state CHA2DS2-VASc is 2 Continue Eliquis 5 mg twice  daily  Hypertension BP reasonably controlled. No changes today    Medication Adjustments/Labs and Tests Ordered: Current medicines are reviewed at length with the patient today.  Concerns regarding medicines are outlined above.  Orders Placed This Encounter  Procedures   EKG 12-Lead   No orders of the defined types were placed in this encounter.  Assessment and plan communicated with referring provider.  Signed, Maurice Small, MD  11/23/2022 9:58 AM    [facility]

## 2022-11-23 NOTE — Progress Notes (Signed)
Electrophysiology Office Note:    Date:  11/23/2022   ID:  Tonya Davis, DOB 07-06-1955, MRN [id number]  PCP:  Elias Else, MD (Inactive)   [facility] Providers Cardiologist:  None Electrophysiologist:  Maurice Small, MD     Referring MD: No ref. provider found   History of Present Illness:    Tonya Davis is a 67 y.o. female with a hx listed below, significant for tension, hyperlipidemia, ankylosing spondylitis, and atrial fibrillation referred for arrhythmia management.  She was diagnosed with atrial fibrillation in November 2023 after presenting to her PCP with palpitations.  At that time, she was recovering from COVID infection. Her Garmin smart watch has generated multiple alerts showing atrial fibrillation. These are scanned in under ECGs.   At her last visit, we scheduled her for A-fib ablation.  At that time, she had not had any episodes for weeks or months.  However, she has had a few episodes since her last visit.  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  She feels prone to recurrences of A-fib when she has minor illnesses.     EKGs/Labs/Other Studies Reviewed Today:    Echocardiogram:  TTE 01/25/2022  1. Left ventricular ejection fraction, by estimation, is 55 to 60%. The  left ventricle has normal function. The left ventricle has no regional  wall motion abnormalities. Left ventricular diastolic parameters are  consistent with Grade I diastolic dysfunction (impaired relaxation).   2. Right ventricular systolic function is normal. The right ventricular  size is normal. Tricuspid regurgitation signal is inadequate for assessing  PA pressure.   3. The mitral valve is normal in structure. Trivial mitral valve  regurgitation. No evidence of mitral stenosis.   4. The aortic valve is tricuspid. Aortic valve regurgitation is not  visualized. No aortic stenosis is present.   5. The inferior vena cava is normal in size with  greater than 50%  respiratory variability, suggesting right atrial pressure of 3 mmHg.    Monitors:  Zio 01/2022 - my interpretation Wear time 14 days Sinus rhythm, HR 38-121, avg 57 1% AF burden, longest 12 minutes. Rates 109 on average, max 165 Symptoms of skipped beats correlated with PACs and AF  Stress testing:    Advanced imaging:   EKG:  Last EKG results: today -- sinus bradycardia, 52 bpm  I personally reviewed the ECG strips from her Garmin consumer monitor. They showed sinus rhythm with PACs as well as atrial fibrillation.  Recent Labs: No results found for requested labs within last 365 days.     Physical Exam:    VS:  BP 120/64 (BP Location: Left Arm, Patient Position: Sitting, Cuff Size: Normal)   Pulse (!) 53   Ht 5\' 4"  (1.626 m)   Wt 150 lb (68 kg)   SpO2 98%   BMI 25.75 kg/m     Wt Readings from Last 3 Encounters:  11/23/22 150 lb (68 kg)  06/16/22 155 lb 3.2 oz (70.4 kg)  05/19/22 155 lb 6.4 oz (70.5 kg)     GEN: Well nourished, well developed in no acute distress CARDIAC: RRR, no murmurs, rubs, gallops RESPIRATORY:  Normal work of breathing MUSCULOSKELETAL: no edema    ASSESSMENT & PLAN:    Paroxysmal atrial fibrillation Episodes have been brief but highly symptomatic She has had a few episodes of AF documented on Kardia recently Will proceed with ablation.  We discussed the indication, rationale, logistics, anticipated benefits, and potential risks  of the ablation procedure including but not limited to -- bleed at the groin access site, chest pain, damage to nearby organs such as the diaphragm, lungs, or esophagus, need for a drainage tube, or prolonged hospitalization. I explained that the risk for stroke, heart attack, need for open chest surgery, or even death is very low but not zero. she  expressed understanding and wishes to proceed.   Secondary hypercoagulable state CHA2DS2-VASc is 2 Continue Eliquis 5 mg twice  daily  Hypertension BP reasonably controlled. No changes today    Medication Adjustments/Labs and Tests Ordered: Current medicines are reviewed at length with the patient today.  Concerns regarding medicines are outlined above.  Orders Placed This Encounter  Procedures   EKG 12-Lead   No orders of the defined types were placed in this encounter.  Assessment and plan communicated with referring provider.  Signed, Maurice Small, MD  11/23/2022 9:58 AM    [facility]

## 2022-11-24 LAB — BASIC METABOLIC PANEL
BUN/Creatinine Ratio: 15 (ref 12–28)
BUN: 10 mg/dL (ref 8–27)
CO2: 23 mmol/L (ref 20–29)
Calcium: 9.8 mg/dL (ref 8.7–10.3)
Chloride: 104 mmol/L (ref 96–106)
Creatinine, Ser: 0.68 mg/dL (ref 0.57–1.00)
Glucose: 82 mg/dL (ref 70–99)
Potassium: 4.2 mmol/L (ref 3.5–5.2)
Sodium: 143 mmol/L (ref 134–144)
eGFR: 97 mL/min/{1.73_m2} (ref >59)

## 2022-11-24 LAB — CBC
Hematocrit: 40.6 % (ref 34.0–46.6)
Hemoglobin: 13.4 g/dL (ref 11.1–15.9)
MCH: 29.5 pg (ref 26.6–33.0)
MCHC: 33 g/dL (ref 31.5–35.7)
MCV: 89 fL (ref 79–97)
Platelets: 265 10*3/uL (ref 150–450)
RBC: 4.55 x10E6/uL (ref 3.77–5.28)
RDW: 13.3 % (ref 11.7–15.4)
WBC: 4.7 10*3/uL (ref 3.4–10.8)

## 2022-12-07 ENCOUNTER — Telehealth (HOSPITAL_COMMUNITY): Payer: Self-pay | Admitting: *Deleted

## 2022-12-07 NOTE — Telephone Encounter (Signed)
Reaching out to patient to offer assistance regarding upcoming cardiac imaging study; pt verbalizes understanding of appt date/time, parking situation and where to check in, pre-test NPO status and verified current allergies; name and call back number provided for further questions should they arise  Larey Brick RN Navigator Cardiac Imaging [facility] [phone] office [phone] cell  Patient aware to arrive at 9 AM.

## 2022-12-08 ENCOUNTER — Ambulatory Visit (HOSPITAL_COMMUNITY)
Admission: RE | Admit: 2022-12-08 | Discharge: 2022-12-08 | Disposition: A | Discharge status: Home / Self Care | Payer: Medicare Other | Source: Ambulatory Visit | Attending: Cardiovascular Disease | Admitting: Cardiovascular Disease

## 2022-12-08 DIAGNOSIS — I480 Paroxysmal atrial fibrillation: Secondary | ICD-10-CM | POA: Insufficient documentation

## 2022-12-08 MED ORDER — IOHEXOL 350 MG/ML SOLN
95.0000 mL | Freq: Once | INTRAVENOUS | Status: AC | PRN
  Administered 2022-12-08: 95 mL via INTRAVENOUS

## 2022-12-13 ENCOUNTER — Encounter: Payer: Self-pay | Admitting: Cardiovascular Disease

## 2022-12-14 NOTE — Pre-Procedure Instructions (Signed)
Instructed patient on the following items: Arrival time 0930 Nothing to eat or drink after midnight No meds AM of procedure Responsible person to drive you home and stay with you for 24 hrs  Have you missed any doses of anti-coagulant Elqiuis- take twice a day, hasn't missed any doses.  Don't take dose in AM

## 2022-12-15 ENCOUNTER — Ambulatory Visit (HOSPITAL_BASED_OUTPATIENT_CLINIC_OR_DEPARTMENT_OTHER): Payer: Medicare Other | Admitting: Registered Nurse

## 2022-12-15 ENCOUNTER — Ambulatory Visit (HOSPITAL_COMMUNITY): Payer: Medicare Other | Admitting: Registered Nurse

## 2022-12-15 ENCOUNTER — Other Ambulatory Visit: Payer: Self-pay

## 2022-12-15 ENCOUNTER — Ambulatory Visit (HOSPITAL_COMMUNITY)
Admission: RE | Admit: 2022-12-15 | Discharge: 2022-12-15 | Disposition: A | Discharge status: Home / Self Care | Payer: Medicare Other | Attending: Cardiovascular Disease | Admitting: Cardiovascular Disease

## 2022-12-15 ENCOUNTER — Ambulatory Visit (HOSPITAL_COMMUNITY)
Admission: RE | Disposition: A | Discharge status: Home / Self Care | Payer: Self-pay | Source: Home / Self Care | Attending: Cardiovascular Disease

## 2022-12-15 DIAGNOSIS — Z7901 Long term (current) use of anticoagulants: Secondary | ICD-10-CM | POA: Diagnosis not present

## 2022-12-15 DIAGNOSIS — I10 Essential (primary) hypertension: Secondary | ICD-10-CM | POA: Diagnosis not present

## 2022-12-15 DIAGNOSIS — I480 Paroxysmal atrial fibrillation: Secondary | ICD-10-CM | POA: Insufficient documentation

## 2022-12-15 DIAGNOSIS — D6869 Other thrombophilia: Secondary | ICD-10-CM | POA: Diagnosis not present

## 2022-12-15 DIAGNOSIS — I4891 Unspecified atrial fibrillation: Secondary | ICD-10-CM

## 2022-12-15 DIAGNOSIS — M459 Ankylosing spondylitis of unspecified sites in spine: Secondary | ICD-10-CM | POA: Insufficient documentation

## 2022-12-15 DIAGNOSIS — E785 Hyperlipidemia, unspecified: Secondary | ICD-10-CM | POA: Diagnosis not present

## 2022-12-15 HISTORY — PX: ATRIAL FIBRILLATION ABLATION: EP1191

## 2022-12-15 SURGERY — ATRIAL FIBRILLATION ABLATION
Anesthesia: General

## 2022-12-15 MED ORDER — ONDANSETRON HCL 4 MG/2ML IJ SOLN
INTRAMUSCULAR | Status: DC | PRN
  Administered 2022-12-15: 4 mg via INTRAVENOUS

## 2022-12-15 MED ORDER — SODIUM CHLORIDE 0.9 % IV SOLN: INTRAVENOUS | Status: DC

## 2022-12-15 MED ORDER — SODIUM CHLORIDE 0.9% FLUSH
3.0000 mL | Freq: Two times a day (BID) | INTRAVENOUS | Status: DC
Start: 2022-12-15 — End: 2022-12-15

## 2022-12-15 MED ORDER — SODIUM CHLORIDE 0.9 % IV SOLN: 250.0000 mL | INTRAVENOUS | Status: DC | PRN

## 2022-12-15 MED ORDER — DEXAMETHASONE SODIUM PHOSPHATE 10 MG/ML IJ SOLN
INTRAMUSCULAR | Status: DC | PRN
  Administered 2022-12-15: 5 mg via INTRAVENOUS

## 2022-12-15 MED ORDER — ACETAMINOPHEN 325 MG PO TABS: 650.0000 mg | ORAL_TABLET | ORAL | Status: DC | PRN

## 2022-12-15 MED ORDER — ROCURONIUM BROMIDE 10 MG/ML (PF) SYRINGE
PREFILLED_SYRINGE | INTRAVENOUS | Status: DC | PRN
  Administered 2022-12-15 (×2): 20 mg via INTRAVENOUS
  Administered 2022-12-15: 40 mg via INTRAVENOUS

## 2022-12-15 MED ORDER — ATROPINE SULFATE 1 MG/ML IV SOLN
INTRAVENOUS | Status: DC | PRN
  Administered 2022-12-15: 1 mg via INTRAVENOUS

## 2022-12-15 MED ORDER — HEPARIN (PORCINE) IN NACL 1000-0.9 UT/500ML-% IV SOLN
INTRAVENOUS | Status: DC | PRN
  Administered 2022-12-15 (×3): 500 mL

## 2022-12-15 MED ORDER — SUGAMMADEX SODIUM 200 MG/2ML IV SOLN
INTRAVENOUS | Status: DC | PRN
  Administered 2022-12-15: 150 mg via INTRAVENOUS

## 2022-12-15 MED ORDER — FENTANYL CITRATE (PF) 250 MCG/5ML IJ SOLN
INTRAMUSCULAR | Status: DC | PRN
  Administered 2022-12-15 (×2): 50 ug via INTRAVENOUS

## 2022-12-15 MED ORDER — PHENYLEPHRINE HCL-NACL 20-0.9 MG/250ML-% IV SOLN
INTRAVENOUS | Status: DC | PRN
  Administered 2022-12-15: 20 ug/min via INTRAVENOUS

## 2022-12-15 MED ORDER — ATROPINE SULFATE 1 MG/10ML IJ SOSY
PREFILLED_SYRINGE | INTRAMUSCULAR | Status: AC
  Filled 2022-12-15: qty 10

## 2022-12-15 MED ORDER — SODIUM CHLORIDE 0.9% FLUSH: 3.0000 mL | INTRAVENOUS | Status: DC | PRN

## 2022-12-15 MED ORDER — LIDOCAINE 2% (20 MG/ML) 5 ML SYRINGE
INTRAMUSCULAR | Status: DC | PRN
  Administered 2022-12-15: 50 mg via INTRAVENOUS

## 2022-12-15 MED ORDER — PROTAMINE SULFATE 10 MG/ML IV SOLN
INTRAVENOUS | Status: DC | PRN
  Administered 2022-12-15: 50 mg via INTRAVENOUS

## 2022-12-15 MED ORDER — HEPARIN SODIUM (PORCINE) 1000 UNIT/ML IJ SOLN
INTRAMUSCULAR | Status: DC | PRN
  Administered 2022-12-15: 13000 [IU] via INTRAVENOUS

## 2022-12-15 MED ORDER — PROPOFOL 10 MG/ML IV BOLUS
INTRAVENOUS | Status: DC | PRN
  Administered 2022-12-15: 130 mg via INTRAVENOUS

## 2022-12-15 MED ORDER — ONDANSETRON HCL 4 MG/2ML IJ SOLN: 4.0000 mg | Freq: Four times a day (QID) | INTRAMUSCULAR | Status: DC | PRN

## 2022-12-15 MED ORDER — FENTANYL CITRATE (PF) 100 MCG/2ML IJ SOLN
INTRAMUSCULAR | Status: AC
  Filled 2022-12-15: qty 2

## 2022-12-15 SURGICAL SUPPLY — 21 items
BAG SNAP BAND KOVER 36X36 (MISCELLANEOUS) IMPLANT
CABLE PFA RX CATH CONN (CABLE) IMPLANT
CATH FARAWAVE ABLATION 31 (CATHETERS) IMPLANT
CATH OCTARAY 2.0 F 3-3-3-3-3 (CATHETERS) IMPLANT
CATH PIGTAIL STEERABLE D1 8.7 (WIRE) IMPLANT
CATH SOUNDSTAR ECO 8FR (CATHETERS) IMPLANT
CATH WEBSTER BI DIR CS D-F CRV (CATHETERS) IMPLANT
CLOSURE PERCLOSE PROSTYLE (VASCULAR PRODUCTS) IMPLANT
COVER SWIFTLINK CONNECTOR (BAG) ×2 IMPLANT
DEVICE CLOSURE MYNXGRIP 6/7F (Vascular Products) IMPLANT
DILATOR VESSEL 38 20CM 16FR (INTRODUCER) IMPLANT
GUIDEWIRE INQWIRE 1.5J.035X260 (WIRE) IMPLANT
INQWIRE 1.5J .035X260CM (WIRE) ×1
PACK EP LATEX FREE (CUSTOM PROCEDURE TRAY) ×1
PACK EP LF (CUSTOM PROCEDURE TRAY) ×2 IMPLANT
PAD DEFIB RADIO PHYSIO CONN (PAD) ×2 IMPLANT
PATCH CARTO3 (PAD) IMPLANT
SHEATH FARADRIVE STEERABLE (SHEATH) IMPLANT
SHEATH PINNACLE 8F 10CM (SHEATH) IMPLANT
SHEATH PINNACLE 9F 10CM (SHEATH) IMPLANT
SHEATH PROBE COVER 6X72 (BAG) IMPLANT

## 2022-12-15 NOTE — Anesthesia Preprocedure Evaluation (Signed)
Anesthesia Evaluation  Patient identified by MRN, date of birth, ID band Patient awake    Reviewed: Allergy & Precautions, NPO status , Patient's Chart, lab work & pertinent test results  Airway Mallampati: II  TM Distance: >3 FB Neck ROM: Full    Dental  (+) Dental Advisory Given   Pulmonary neg pulmonary ROS   breath sounds clear to auscultation       Cardiovascular hypertension, Pt. on medications and Pt. on home beta blockers  Rhythm:Regular Rate:Normal     Neuro/Psych negative neurological ROS     GI/Hepatic negative GI ROS, Neg liver ROS,,,  Endo/Other  negative endocrine ROS    Renal/GU negative Renal ROS     Musculoskeletal  (+) Arthritis ,    Abdominal   Peds  Hematology negative hematology ROS (+)   Anesthesia Other Findings   Reproductive/Obstetrics                             Anesthesia Physical Anesthesia Plan  ASA: 2  Anesthesia Plan:    Post-op Pain Management: Ofirmev IV (intra-op)*   Induction: Intravenous  PONV Risk Score and Plan: 2 and Dexamethasone, Ondansetron and Treatment may vary due to age or medical condition  Airway Management Planned: Oral ETT  Additional Equipment:   Intra-op Plan:   Post-operative Plan: Extubation in OR  Informed Consent: I have reviewed the patients History and Physical, chart, labs and discussed the procedure including the risks, benefits and alternatives for the proposed anesthesia with the patient or authorized representative who has indicated his/her understanding and acceptance.     Dental advisory given  Plan Discussed with: CRNA  Anesthesia Plan Comments:        Anesthesia Quick Evaluation

## 2022-12-15 NOTE — Progress Notes (Signed)
Pt ambulated to and from bathroom to void with no signs of oozing from bilateral groin sites  

## 2022-12-15 NOTE — Discharge Instructions (Signed)

## 2022-12-15 NOTE — Interval H&P Note (Signed)
History and Physical Interval Note:  12/15/2022 10:22 AM  Tonya Davis  has presented today for surgery, with the diagnosis of afib.  The various methods of treatment have been discussed with the patient and family. After consideration of risks, benefits and other options for treatment, the patient has consented to  Procedure(s): ATRIAL FIBRILLATION ABLATION (N/A) as a surgical intervention.  The patient's history has been reviewed, patient examined, no change in status, stable for surgery.  I have reviewed the patient's chart and labs.  Questions were answered to the patient's satisfaction.    I reviewed the patient's CT and labs. There was no LAA thrombus. she  has not missed any doses of anticoagulation, and she took her dose last night. There have been no changes in the patient's diagnoses, medications, or condition since our recent clinic visit.   Tonya Davis

## 2022-12-15 NOTE — Anesthesia Procedure Notes (Signed)
Procedure Name: Intubation Date/Time: 12/15/2022 10:56 AM  Performed by: Sharyn Dross, CRNAPre-anesthesia Checklist: Patient identified, Emergency Drugs available, Suction available and Patient being monitored Patient Re-evaluated:Patient Re-evaluated prior to induction Oxygen Delivery Method: Circle system utilized Preoxygenation: Pre-oxygenation with 100% oxygen Induction Type: IV induction Ventilation: Mask ventilation without difficulty Laryngoscope Size: Mac and 4 Grade View: Grade I Tube type: Oral Tube size: 7.0 mm Number of attempts: 1 Airway Equipment and Method: Stylet and Oral airway Placement Confirmation: ETT inserted through vocal cords under direct vision, positive ETCO2 and breath sounds checked- equal and bilateral Secured at: 21 cm Tube secured with: Tape Dental Injury: Teeth and Oropharynx as per pre-operative assessment

## 2022-12-15 NOTE — Transfer of Care (Signed)
Immediate Anesthesia Transfer of Care Note  Patient: Tonya Davis  Procedure(s) Performed: ATRIAL FIBRILLATION ABLATION  Patient Location: PACU  Anesthesia Type:General  Level of Consciousness: awake, alert , and oriented  Airway & Oxygen Therapy: Patient connected to nasal cannula oxygen  Post-op Assessment: Report given to RN and Post -op Vital signs reviewed and stable  Post vital signs: Reviewed and stable  Last Vitals:  Vitals Value Taken Time  BP 120/60   Temp    Pulse    Resp    SpO2 99     Last Pain:  Vitals:   12/15/22 0956  TempSrc: Oral  PainSc: 0-No pain         Complications: There were no known notable events for this encounter.

## 2022-12-15 NOTE — Anesthesia Postprocedure Evaluation (Signed)
Anesthesia Post Note  Patient: Tonya Davis  Procedure(s) Performed: ATRIAL FIBRILLATION ABLATION     Patient location during evaluation: PACU Anesthesia Type: General Level of consciousness: awake and alert Pain management: pain level controlled Vital Signs Assessment: post-procedure vital signs reviewed and stable Respiratory status: spontaneous breathing, nonlabored ventilation, respiratory function stable and patient connected to nasal cannula oxygen Cardiovascular status: blood pressure returned to baseline and stable Postop Assessment: no apparent nausea or vomiting Anesthetic complications: no  There were no known notable events for this encounter.  Last Vitals:  Vitals:   12/15/22 1345 12/15/22 1400  BP: 109/77 118/80  Pulse: 63   Resp: 14 13  Temp:    SpO2: 100%     Last Pain:  Vitals:   12/15/22 1339  TempSrc:   PainSc: 0-No pain                 Kennieth Rad

## 2022-12-16 ENCOUNTER — Encounter (HOSPITAL_COMMUNITY): Payer: Self-pay | Admitting: Cardiovascular Disease

## 2022-12-16 LAB — POCT ACTIVATED CLOTTING TIME: Activated Clotting Time: 314 s

## 2022-12-16 MED FILL — Fentanyl Citrate Preservative Free (PF) Inj 100 MCG/2ML: INTRAMUSCULAR | Qty: 2 | Status: AC

## 2022-12-17 ENCOUNTER — Telehealth: Payer: Self-pay | Admitting: Cardiovascular Disease

## 2022-12-17 NOTE — Telephone Encounter (Signed)
Pt has a couple questions regarding her ablation a few days ago. She requested to speak with April, CMA

## 2022-12-17 NOTE — Telephone Encounter (Signed)
Spoke with patient, states both groin sites are sore and a "hard lump" slightly larger than a pecan under skin at left groin, has not changed since she noticed last night. Denies redness, drainage, or warm to touch. Confirmed follow up appointment on 01/11/23 at the AF clinic. No further needs at this time

## 2022-12-21 ENCOUNTER — Encounter: Payer: Self-pay | Admitting: Cardiovascular Disease

## 2023-01-11 ENCOUNTER — Encounter (HOSPITAL_COMMUNITY): Payer: Self-pay | Admitting: Physician Assistant

## 2023-01-11 ENCOUNTER — Ambulatory Visit (HOSPITAL_COMMUNITY)
Admission: RE | Admit: 2023-01-11 | Discharge: 2023-01-11 | Disposition: A | Discharge status: Home / Self Care | Payer: Medicare Other | Source: Ambulatory Visit | Attending: Physician Assistant | Admitting: Physician Assistant

## 2023-01-11 VITALS — BP 128/90 | HR 61 | Ht 64.0 in | Wt 152.0 lb

## 2023-01-11 DIAGNOSIS — I480 Paroxysmal atrial fibrillation: Secondary | ICD-10-CM | POA: Diagnosis not present

## 2023-01-11 DIAGNOSIS — I10 Essential (primary) hypertension: Secondary | ICD-10-CM | POA: Insufficient documentation

## 2023-01-11 DIAGNOSIS — I4891 Unspecified atrial fibrillation: Secondary | ICD-10-CM | POA: Diagnosis present

## 2023-01-11 DIAGNOSIS — M459 Ankylosing spondylitis of unspecified sites in spine: Secondary | ICD-10-CM | POA: Diagnosis not present

## 2023-01-11 DIAGNOSIS — Z7901 Long term (current) use of anticoagulants: Secondary | ICD-10-CM | POA: Insufficient documentation

## 2023-01-11 DIAGNOSIS — E785 Hyperlipidemia, unspecified: Secondary | ICD-10-CM | POA: Diagnosis present

## 2023-01-11 NOTE — Progress Notes (Signed)
Primary Care Physician: Noberto Retort, MD Primary Cardiologist: none Primary Electrophysiologist: Dr Nelly Laurence Referring Physician: Dr Dennard Schaumann Tonya Davis is a 67 y.o. female with a history of HTN, HLD, ankylosing spondylitis, atrial fibrillation who presents for follow up in the [facility].  The patient was initially diagnosed with atrial fibrillation 12/31/21 after presenting to her PCP with symptoms of palpitations. Her Garmin smart watch also alerted that she had possible afib. ECG at that time showed SR but her Garmin strips do show true afib (personally reviewed). Patient was started on Eliquis for a CHADS2VASC score of 2. She denies significant snoring or alcohol use. She did have COVID about one month prior to the onset of her palpitations. She wore a Zio monitor which showed 1% afib burden.   On follow up today, patient is s/p afib ablation with Dr Nelly Laurence on 12/15/22. Patient reports that she has done well from an afib standpoint. She has not had any interim symptoms of afib. She did have a walnut sized swelling at her left groin site which was tender. This has improved and is now pea sized. She has also been having acute issues with her chronic sciatica.   Today, she denies symptoms of chest pain, shortness of breath, orthopnea, PND, lower extremity edema, dizziness, presyncope, syncope, snoring, daytime somnolence, bleeding, or neurologic sequela. The patient is tolerating medications without difficulties and is otherwise without complaint today.    Atrial Fibrillation Risk Factors:  she does not have symptoms or diagnosis of sleep apnea. she does not have a history of rheumatic fever. she does have a history of alcohol use. The patient does have a history of early familial atrial fibrillation or other arrhythmias. Father had PPM.   Atrial Fibrillation Management history:  Previous antiarrhythmic drugs: none Previous cardioversions:  none Previous ablations: 12/15/22 Anticoagulation history: Eliquis   Past Medical History:  Diagnosis Date   Ankylosing spondylitis (HCC)    Hypertension     Current Outpatient Medications  Medication Sig Dispense Refill   acetaminophen (TYLENOL) 325 MG tablet Take 325-650 mg by mouth every 6 (six) hours as needed for headache or moderate pain (pain score 4-6).     amLODipine (NORVASC) 2.5 MG tablet Take 1 tablet (2.5 mg total) by mouth daily. 30 tablet 3   apixaban (ELIQUIS) 5 MG TABS tablet TAKE ONE TABLET BY MOUTH TWICE DAILY 60 tablet 6   bisoprolol-hydrochlorothiazide (ZIAC) 5-6.25 MG tablet Take 1 tablet by mouth every morning.     cetirizine (ZYRTEC ALLERGY) 10 MG tablet Take 5 mg by mouth daily as needed for allergies.     famotidine-calcium carbonate-magnesium hydroxide (PEPCID COMPLETE) 10-800-165 MG chewable tablet Chew 1 tablet by mouth daily as needed (indigestion).     Probiotic Product (PROBIOTIC PO) Take 1 Can by mouth daily.     No current facility-administered medications for this encounter.    ROS- All systems are reviewed and negative except as per the HPI above.  Physical Exam: Vitals:   01/11/23 1007  BP: (!) 128/90  Pulse: 61  Weight: 68.9 kg  Height: 5\' 4"  (1.626 m)     GEN: Well nourished, well developed in no acute distress NECK: No JVD; No carotid bruits CARDIAC: Regular rate and rhythm, no murmurs, rubs, gallops RESPIRATORY:  Clear to auscultation without rales, wheezing or rhonchi  ABDOMEN: Soft, non-tender, non-distended EXTREMITIES:  No edema; No deformity    Wt Readings from Last 3 Encounters:  01/11/23 68.9  kg  12/15/22 69.4 kg  11/23/22 68 kg    EKG today demonstrates  SR Vent. rate 61 BPM PR interval 178 ms QRS duration 82 ms QT/QTcB 404/406 ms  Echo 01/25/22  1. Left ventricular ejection fraction, by estimation, is 55 to 60%. The  left ventricle has normal function. The left ventricle has no regional  wall motion  abnormalities. Left ventricular diastolic parameters are  consistent with Grade I diastolic dysfunction (impaired relaxation).   2. Right ventricular systolic function is normal. The right ventricular  size is normal. Tricuspid regurgitation signal is inadequate for assessing  PA pressure.   3. The mitral valve is normal in structure. Trivial mitral valve  regurgitation. No evidence of mitral stenosis.   4. The aortic valve is tricuspid. Aortic valve regurgitation is not  visualized. No aortic stenosis is present.   5. The inferior vena cava is normal in size with greater than 50%  respiratory variability, suggesting right atrial pressure of 3 mmHg.   Epic records are reviewed at length today  CHA2DS2-VASc Score = 2  The patient's score is based upon: CHF History: 0 HTN History: 1 Diabetes History: 0 Stroke History: 0 Vascular Disease History: 0 Age Score: 0 Gender Score: 1       ASSESSMENT AND PLAN: Paroxysmal Atrial Fibrillation (ICD10:  I48.0) The patient's CHA2DS2-VASc score is 2, indicating a 2.2% annual risk of stroke.   S/p afib ablation 12/15/22 Patient appears to be maintaining SR Continue Eliquis 5 mg BID with no missed doses for 3 months post ablation Continue bisoprolol-HCTZ 5-6.25 mg daily Apple Watch for home monitoring.   HTN Stable on current regimen   Follow up with Dr Nelly Laurence as scheduled.    Jorja Loa PA-C Afib Clinic [facility] [address] [place], [place] [address] [phone] 01/11/2023 11:20 AM

## 2023-03-17 ENCOUNTER — Encounter: Payer: Self-pay | Admitting: Cardiovascular Disease

## 2023-03-17 ENCOUNTER — Ambulatory Visit: Payer: Medicare Other | Attending: Cardiovascular Disease | Admitting: Cardiovascular Disease

## 2023-03-17 VITALS — BP 134/80 | HR 54 | Ht 64.0 in | Wt 156.0 lb

## 2023-03-17 DIAGNOSIS — I480 Paroxysmal atrial fibrillation: Secondary | ICD-10-CM

## 2023-03-17 NOTE — Patient Instructions (Signed)
Medication Instructions:  Your physician recommends that you continue on your current medications as directed. Please refer to the Current Medication list given to you today. *If you need a refill on your cardiac medications before your next appointment, please call your pharmacy*   Follow-Up: At [facility], you and your health needs are our priority.  As part of our continuing mission to provide you with exceptional heart care, we have created designated Provider Care Teams.  These Care Teams include your primary Cardiologist (physician) and Advanced Practice Providers (APPs -  Physician Assistants and Nurse Practitioners) who all work together to provide you with the care you need, when you need it.  We recommend signing up for the patient portal called "MyChart".  Sign up information is provided on this After Visit Summary.  MyChart is used to connect with patients for Virtual Visits (Telemedicine).  Patients are able to view lab/test results, encounter notes, upcoming appointments, etc.  Non-urgent messages can be sent to your provider as well.   To learn more about what you can do with MyChart, go to [email or web address].    Your next appointment:   1 year(s)  Provider:   York Pellant, MD       1st Floor: - Lobby - Registration  - Pharmacy  - Lab - Cafe  2nd Floor: - PV Lab - Diagnostic Testing (echo, CT, nuclear med)  3rd Floor: - Vacant  4th Floor: - TCTS (cardiothoracic surgery) - AFib Clinic - Structural Heart Clinic - Vascular Surgery  - Vascular Ultrasound  5th Floor: - HeartCare Cardiology (general and EP) - Clinical Pharmacy for coumadin, hypertension, lipid, weight-loss medications, and med management appointments    Valet parking services will be available as well.

## 2023-03-17 NOTE — Progress Notes (Signed)
Electrophysiology Office Note:    Date:  03/17/2023   ID:  Tonya Davis, DOB 1955/07/13, MRN [id number]  PCP:  Noberto Retort, MD   [facility] Providers Cardiologist:  None Electrophysiologist:  Maurice Small, MD     Referring MD: No ref. provider found   History of Present Illness:    Tonya Davis is a 68 y.o. female with a hx listed below, significant for tension, hyperlipidemia, ankylosing spondylitis, and atrial fibrillation referred for arrhythmia management.  She was diagnosed with atrial fibrillation in November 2023 after presenting to her PCP with palpitations.  At that time, she was recovering from COVID infection. Her Garmin smart watch has generated multiple alerts showing atrial fibrillation. These are scanned in under ECGs.   At her last visit, we scheduled her for A-fib ablation.  At that time, she had not had any episodes for weeks or months.  However, she has had a few episodes since her last visit.  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  She feels prone to recurrences of A-fib when she has minor illnesses.  She underwent ablation of the pulmonary veins and left atrial posterior wall in October 2024.  Since the ablation, she has not had any symptoms to suggest recurrence of atrial fibrillation.  She has been more active and has resumed full exercise regimen.   EKGs/Labs/Other Studies Reviewed Today:    Echocardiogram:  TTE 01/25/2022  1. Left ventricular ejection fraction, by estimation, is 55 to 60%. The  left ventricle has normal function. The left ventricle has no regional  wall motion abnormalities. Left ventricular diastolic parameters are  consistent with Grade I diastolic dysfunction (impaired relaxation).   2. Right ventricular systolic function is normal. The right ventricular  size is normal. Tricuspid regurgitation signal is inadequate for assessing  PA pressure.   3. The mitral valve is normal in  structure. Trivial mitral valve  regurgitation. No evidence of mitral stenosis.   4. The aortic valve is tricuspid. Aortic valve regurgitation is not  visualized. No aortic stenosis is present.   5. The inferior vena cava is normal in size with greater than 50%  respiratory variability, suggesting right atrial pressure of 3 mmHg.    Monitors:  Zio 01/2022 - my interpretation Wear time 14 days Sinus rhythm, HR 38-121, avg 57 1% AF burden, longest 12 minutes. Rates 109 on average, max 165 Symptoms of skipped beats correlated with PACs and AF  Stress testing:    Advanced imaging:   EKG:  Last EKG results: today -- sinus bradycardia, 52 bpm  I personally reviewed the ECG strips from her Garmin consumer monitor. They showed sinus rhythm with PACs as well as atrial fibrillation.  Recent Labs: 11/23/2022: BUN 10; Creatinine, Ser 0.68; Hemoglobin 13.4; Platelets 265; Potassium 4.2; Sodium 143     Physical Exam:    VS:  BP 134/80 (BP Location: Left Arm, Patient Position: Sitting, Cuff Size: Normal)   Pulse (!) 54   Ht 5\' 4"  (1.626 m)   Wt 156 lb (70.8 kg)   SpO2 98%   BMI 26.78 kg/m     Wt Readings from Last 3 Encounters:  03/17/23 156 lb (70.8 kg)  01/11/23 152 lb (68.9 kg)  12/15/22 153 lb (69.4 kg)     GEN: Well nourished, well developed in no acute distress CARDIAC: RRR, no murmurs, rubs, gallops RESPIRATORY:  Normal work of breathing MUSCULOSKELETAL: no edema    ASSESSMENT &  PLAN:    Paroxysmal atrial fibrillation Episodes have been brief but highly symptomatic She has had a few episodes of AF documented on Kardia  She underwent PFA ablation of the pulmonary veins and posterior wall on December 15, 2022 She reports that she has since been maintaining normal rhythm and is doing very well  Secondary hypercoagulable state CHA2DS2-VASc is 2 Continue Eliquis 5 mg twice daily  Hypertension BP reasonably controlled. No changes today    Medication  Adjustments/Labs and Tests Ordered: Current medicines are reviewed at length with the patient today.  Concerns regarding medicines are outlined above.  Orders Placed This Encounter  Procedures   EKG 12-Lead   No orders of the defined types were placed in this encounter.  Assessment and plan communicated with referring provider.  Signed, Maurice Small, MD  03/17/2023 12:21 PM    [facility]

## 2023-04-14 ENCOUNTER — Other Ambulatory Visit (HOSPITAL_COMMUNITY): Payer: Self-pay | Admitting: Physician Assistant

## 2023-04-14 DIAGNOSIS — I480 Paroxysmal atrial fibrillation: Secondary | ICD-10-CM

## 2023-04-14 NOTE — Telephone Encounter (Signed)
 Prescription refill request for Eliquis received. Indication: Afib  Last office visit: 03/17/23 ([facility])  Scr: 0.68 (11/23/22)  Age: 68 Weight: 70.8kg  Appropriate dose. Refill sent.

## 2023-05-10 ENCOUNTER — Other Ambulatory Visit: Payer: Self-pay | Admitting: Family Medicine

## 2023-05-10 DIAGNOSIS — Z1231 Encounter for screening mammogram for malignant neoplasm of breast: Secondary | ICD-10-CM

## 2023-05-26 ENCOUNTER — Ambulatory Visit
Admission: RE | Admit: 2023-05-26 | Discharge: 2023-05-26 | Disposition: A | Discharge status: Home / Self Care | Source: Ambulatory Visit | Attending: Family Medicine | Admitting: Family Medicine

## 2023-05-26 DIAGNOSIS — Z1231 Encounter for screening mammogram for malignant neoplasm of breast: Secondary | ICD-10-CM

## 2023-10-11 ENCOUNTER — Other Ambulatory Visit (HOSPITAL_COMMUNITY): Payer: Self-pay | Admitting: Cardiovascular Disease

## 2023-10-11 DIAGNOSIS — I480 Paroxysmal atrial fibrillation: Secondary | ICD-10-CM

## 2023-10-11 NOTE — Telephone Encounter (Signed)
 Eliquis  5mg  refill request received. Patient is 68 years old, weight-70.8kg, Crea-0.68 on 11/23/62, Diagnosis-Afib, and last seen by Dr. Nancey on 03/17/23. Dose is appropriate based on dosing criteria. Will send in refill to requested pharmacy.

## 2024-03-03 ENCOUNTER — Telehealth: Payer: Self-pay | Admitting: Cardiology

## 2024-03-03 NOTE — Telephone Encounter (Signed)
" ° °  Patient called outpatient line regarding Eliquis  dosage.  She forgot to take her PM dose 03/02/2024.  In the morning when she realized this around 6:30 AM, she took that dose from the evening.  Then without thinking at around 8:30 AM when she typically takes her morning medications, she took another dose of Eliquis .  She was calling in regards to advice with ongoing dosages. I instructed her to continue with her dosing regimen of 12 hours apart, taking her next dose at 8:30 PM.  Instructed her to continue to monitor for signs of bleeding, as she typically does on Eliquis .  She denies any lightheadedness, syncope, presyncope, signs of bleeding. She expresses understanding. All questions were answered.  Waddell DELENA Donath, PA-C 03/03/2024 2:04 PM  "

## 2024-05-23 ENCOUNTER — Ambulatory Visit: Admitting: Cardiovascular Disease
